# Patient Record
Sex: Male | Born: 1960 | Race: White | Hispanic: No | Marital: Single | Smoking: Former smoker
Health system: Southern US, Community
[De-identification: ages and names within clinical notes are randomized; demographics above are authoritative.]

## PROBLEM LIST (undated history)

## (undated) DIAGNOSIS — L719 Rosacea, unspecified: Secondary | ICD-10-CM

## (undated) DIAGNOSIS — N529 Male erectile dysfunction, unspecified: Secondary | ICD-10-CM

## (undated) DIAGNOSIS — E785 Hyperlipidemia, unspecified: Secondary | ICD-10-CM

## (undated) DIAGNOSIS — B001 Herpesviral vesicular dermatitis: Secondary | ICD-10-CM

## (undated) DIAGNOSIS — F988 Other specified behavioral and emotional disorders with onset usually occurring in childhood and adolescence: Secondary | ICD-10-CM

## (undated) DIAGNOSIS — F32A Depression, unspecified: Secondary | ICD-10-CM

## (undated) DIAGNOSIS — F419 Anxiety disorder, unspecified: Secondary | ICD-10-CM

## (undated) HISTORY — PX: COLONOSCOPY: SHX174

## (undated) HISTORY — DX: Anxiety disorder, unspecified: F41.9

## (undated) HISTORY — DX: Male erectile dysfunction, unspecified: N52.9

## (undated) HISTORY — PX: OPEN REDUCTION INTERNAL FIXATION (ORIF) HAND: SHX5991

## (undated) HISTORY — DX: Depression, unspecified: F32.A

## (undated) HISTORY — DX: Other specified behavioral and emotional disorders with onset usually occurring in childhood and adolescence: F98.8

## (undated) HISTORY — DX: Herpesviral vesicular dermatitis: B00.1

## (undated) HISTORY — DX: Hyperlipidemia, unspecified: E78.5

## (undated) HISTORY — DX: Rosacea, unspecified: L71.9

---
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
## 1993-03-16 DIAGNOSIS — F419 Anxiety disorder, unspecified: Secondary | ICD-10-CM | POA: Insufficient documentation

## 1998-03-16 DIAGNOSIS — F32A Depression, unspecified: Secondary | ICD-10-CM | POA: Insufficient documentation

## 2008-03-23 ENCOUNTER — Ambulatory Visit: Payer: Self-pay | Admitting: Family Medicine

## 2008-04-05 ENCOUNTER — Ambulatory Visit: Payer: Self-pay | Admitting: Family Medicine

## 2009-12-30 ENCOUNTER — Emergency Department (HOSPITAL_COMMUNITY): Admission: EM | Admit: 2009-12-30 | Discharge: 2009-12-30 | Payer: Self-pay | Admitting: Emergency Medicine

## 2010-12-25 ENCOUNTER — Inpatient Hospital Stay (INDEPENDENT_AMBULATORY_CARE_PROVIDER_SITE_OTHER)
Admission: RE | Admit: 2010-12-25 | Discharge: 2010-12-25 | Disposition: A | Discharge status: Home / Self Care | Payer: BC Managed Care – PPO | Source: Ambulatory Visit | Attending: Family Medicine | Admitting: Family Medicine

## 2010-12-25 ENCOUNTER — Ambulatory Visit (INDEPENDENT_AMBULATORY_CARE_PROVIDER_SITE_OTHER): Payer: BC Managed Care – PPO

## 2010-12-25 DIAGNOSIS — J45909 Unspecified asthma, uncomplicated: Secondary | ICD-10-CM

## 2011-05-20 IMAGING — CR DG CHEST 2V
3 series · 3 of 3 positions shown · non-contrast
Comparison: None

CLINICAL DATA: Left-sided chest pain.

CHEST - 2 VIEW

[view not recorded (1 of 3)]
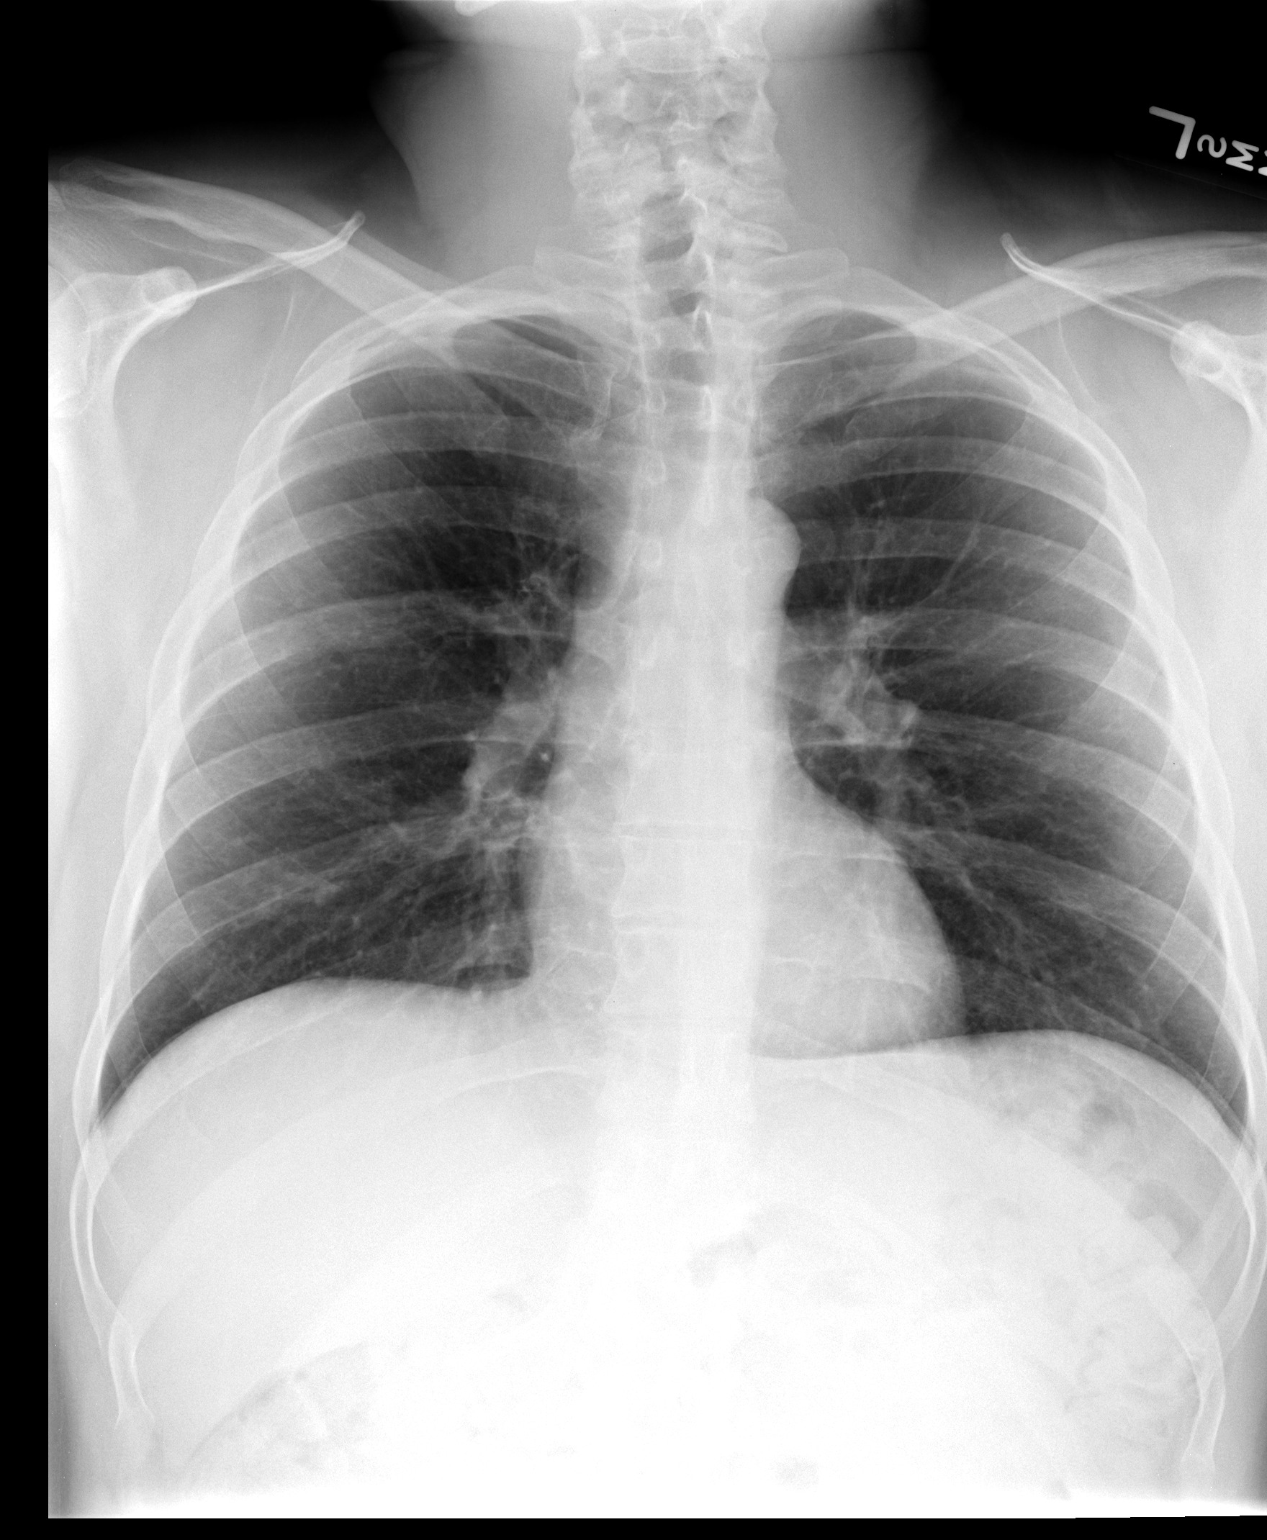

[view not recorded (2 of 3)]
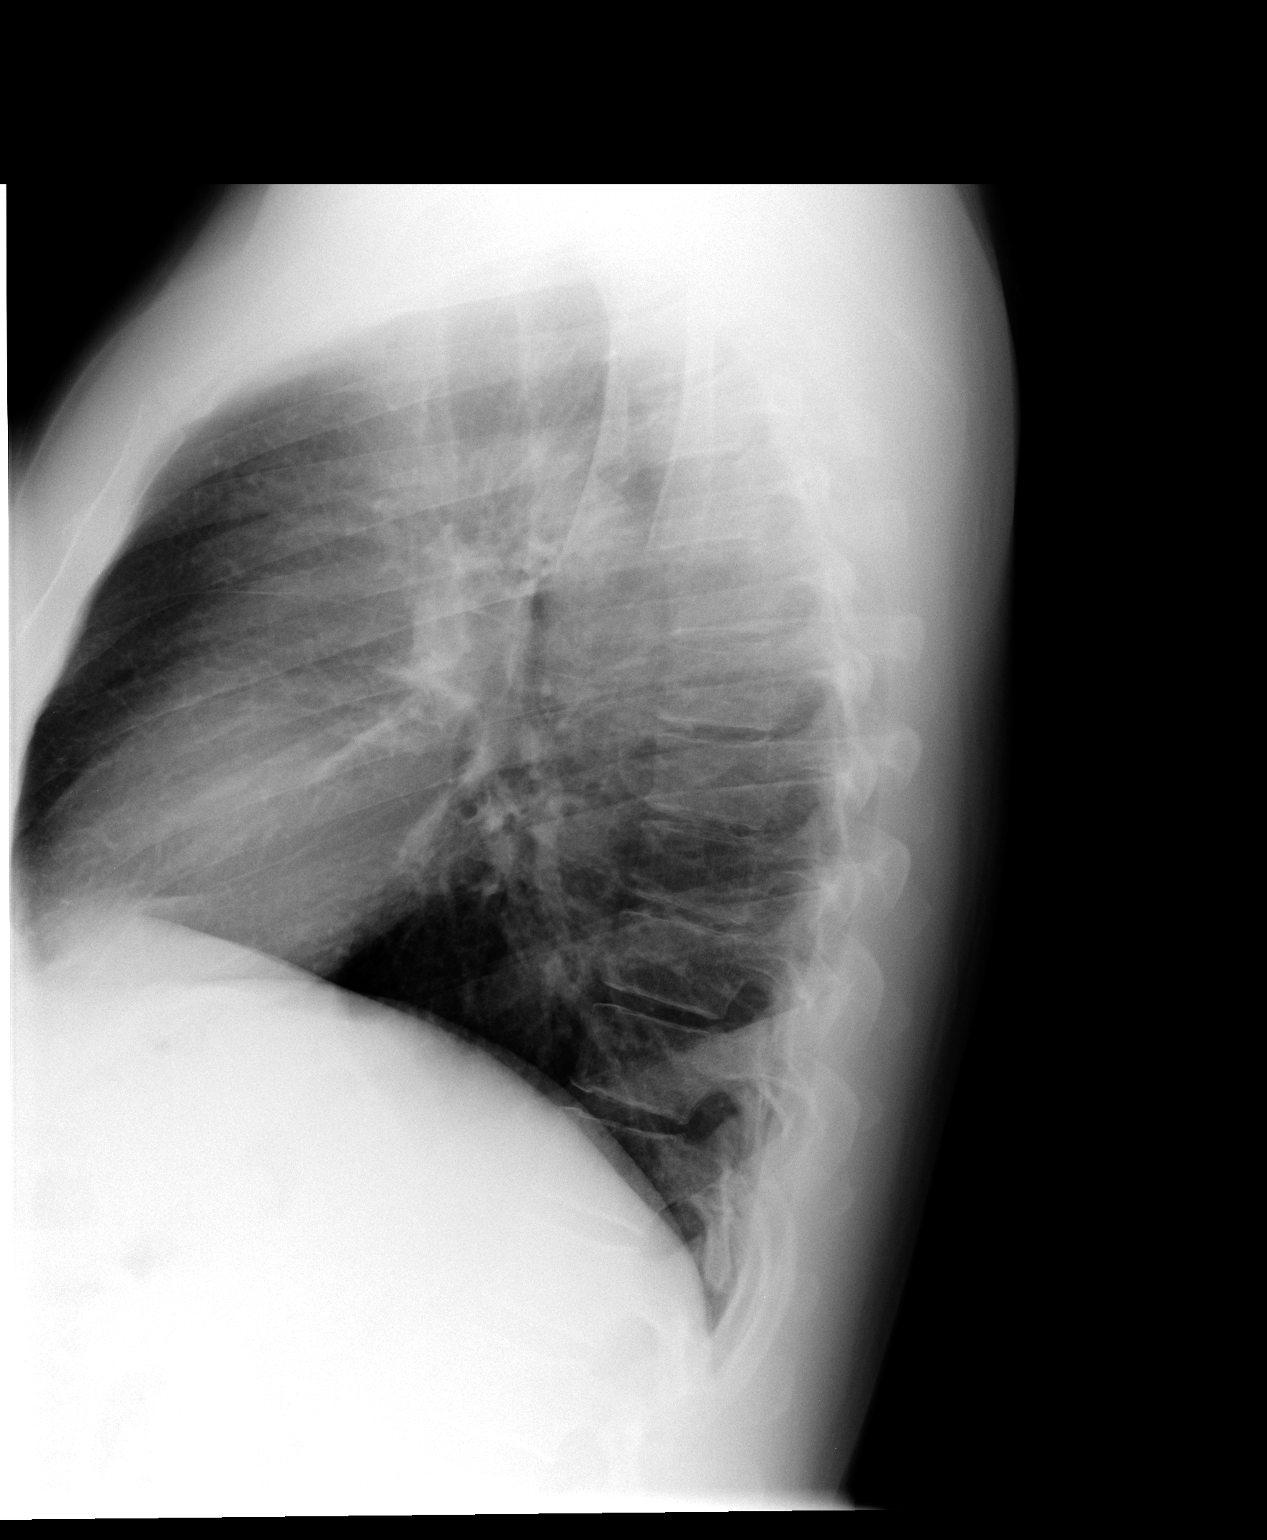

[view not recorded (3 of 3)]
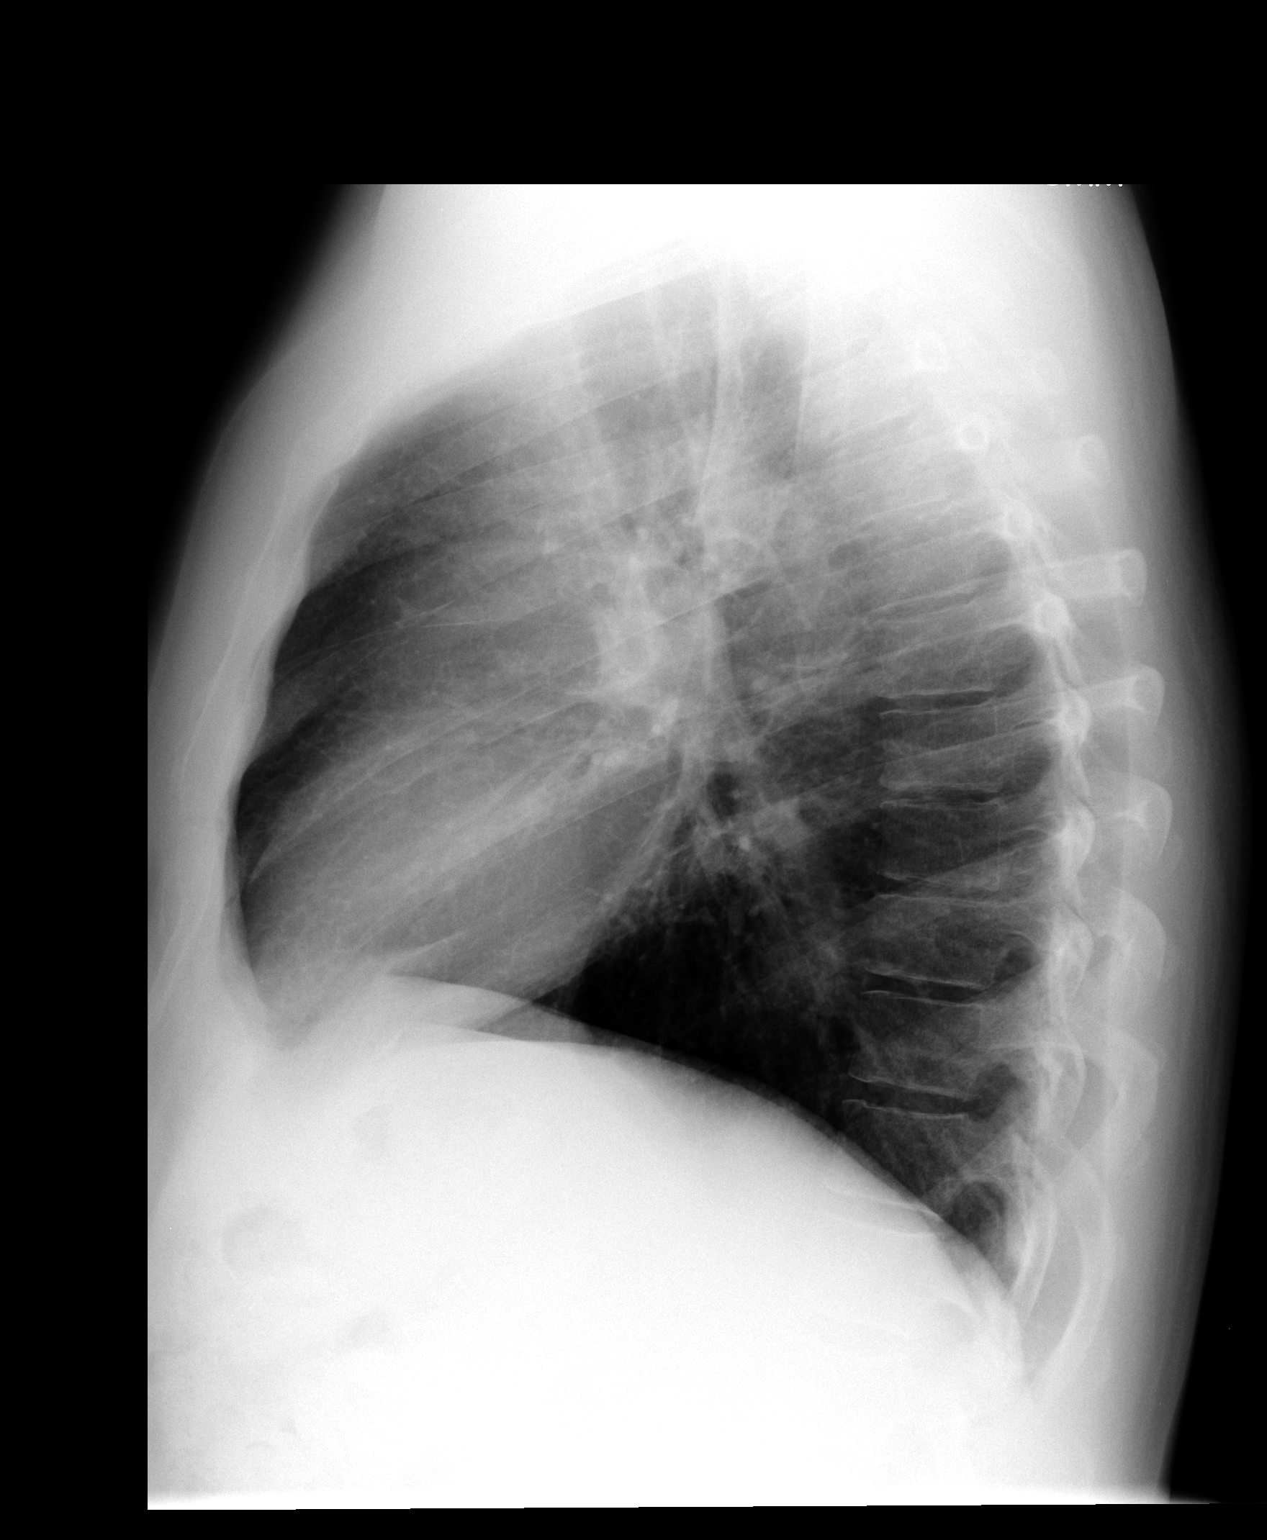

[3 of 3 positions shown; findings below may reference images not displayed]

FINDINGS: The cardiac silhouette, mediastinal and hilar contours
are within normal limits.  The lungs are clear.  The bony thorax is
intact.
IMPRESSION: No acute cardiopulmonary findings.

## 2012-05-14 IMAGING — CR DG CHEST 2V
2 series · 2 of 2 positions shown · non-contrast
Comparison: 12/30/2009

CLINICAL DATA: Shortness of breath, wheezing, cough, nonsmoker

CHEST - 2 VIEW

[view not recorded (1 of 2)]
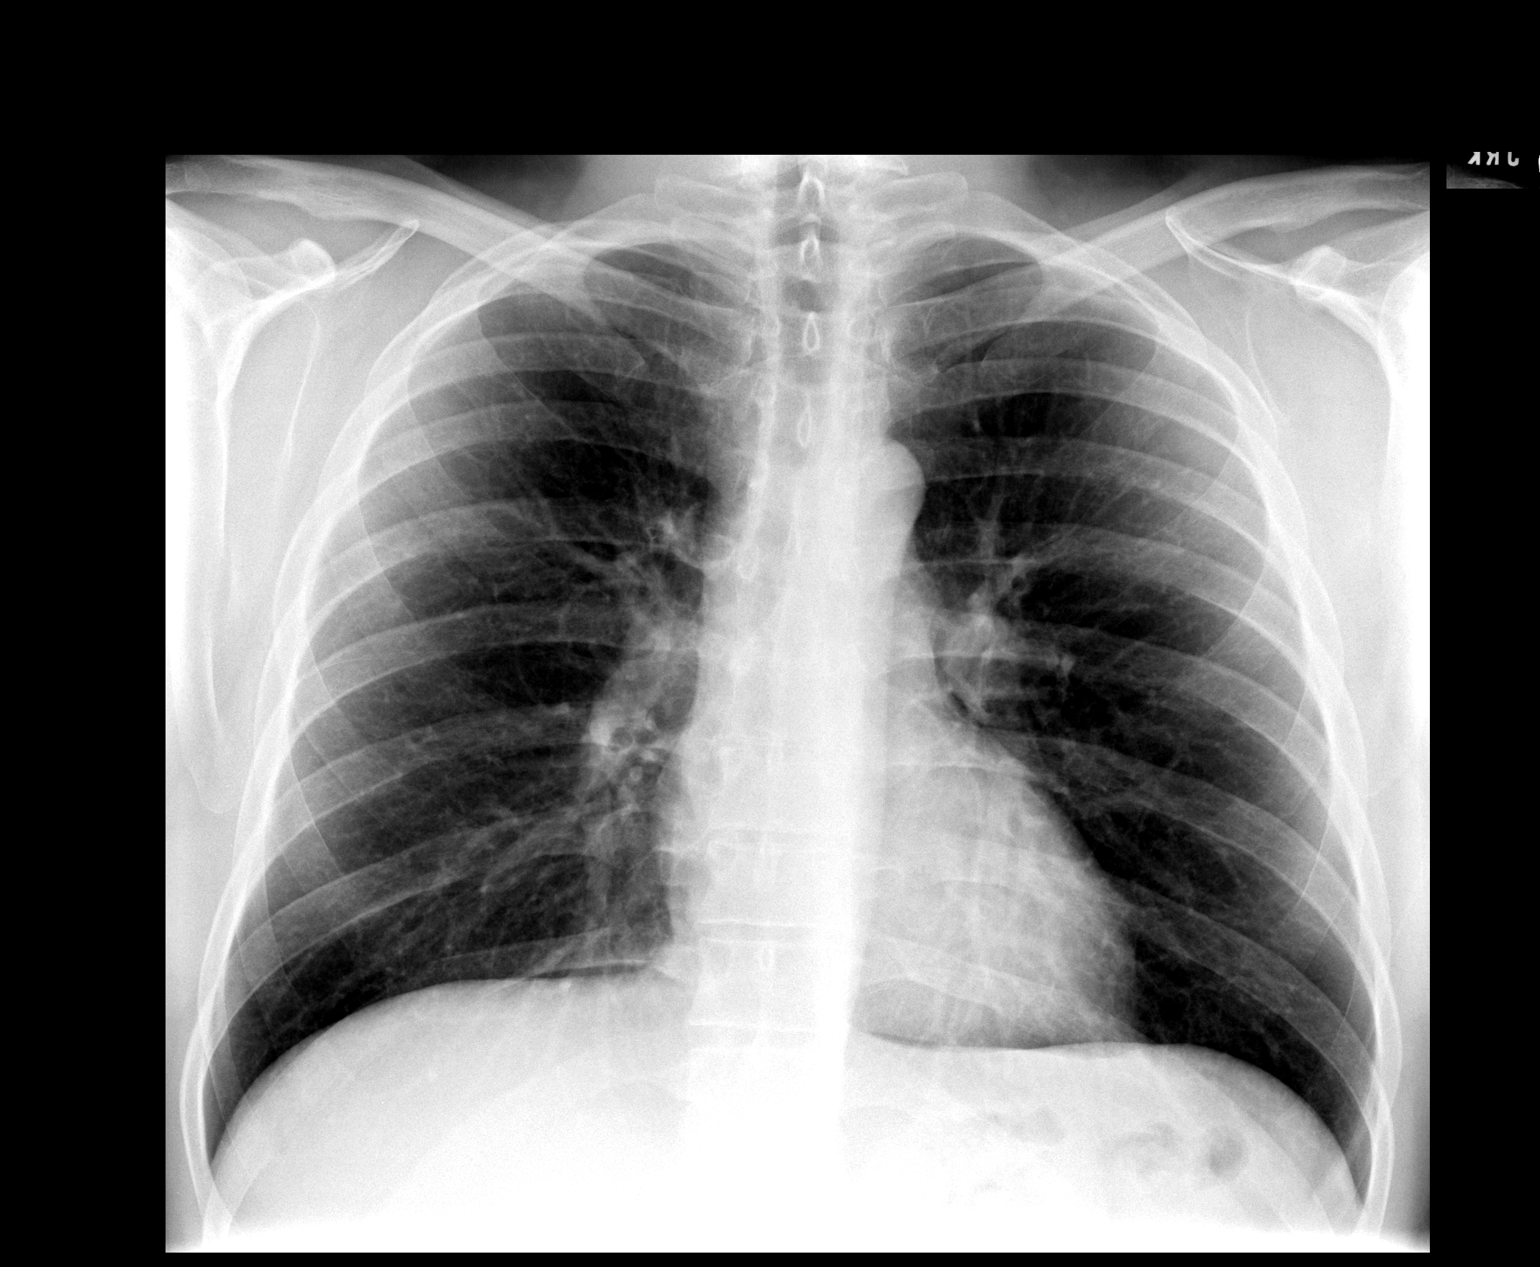

[view not recorded (2 of 2)]
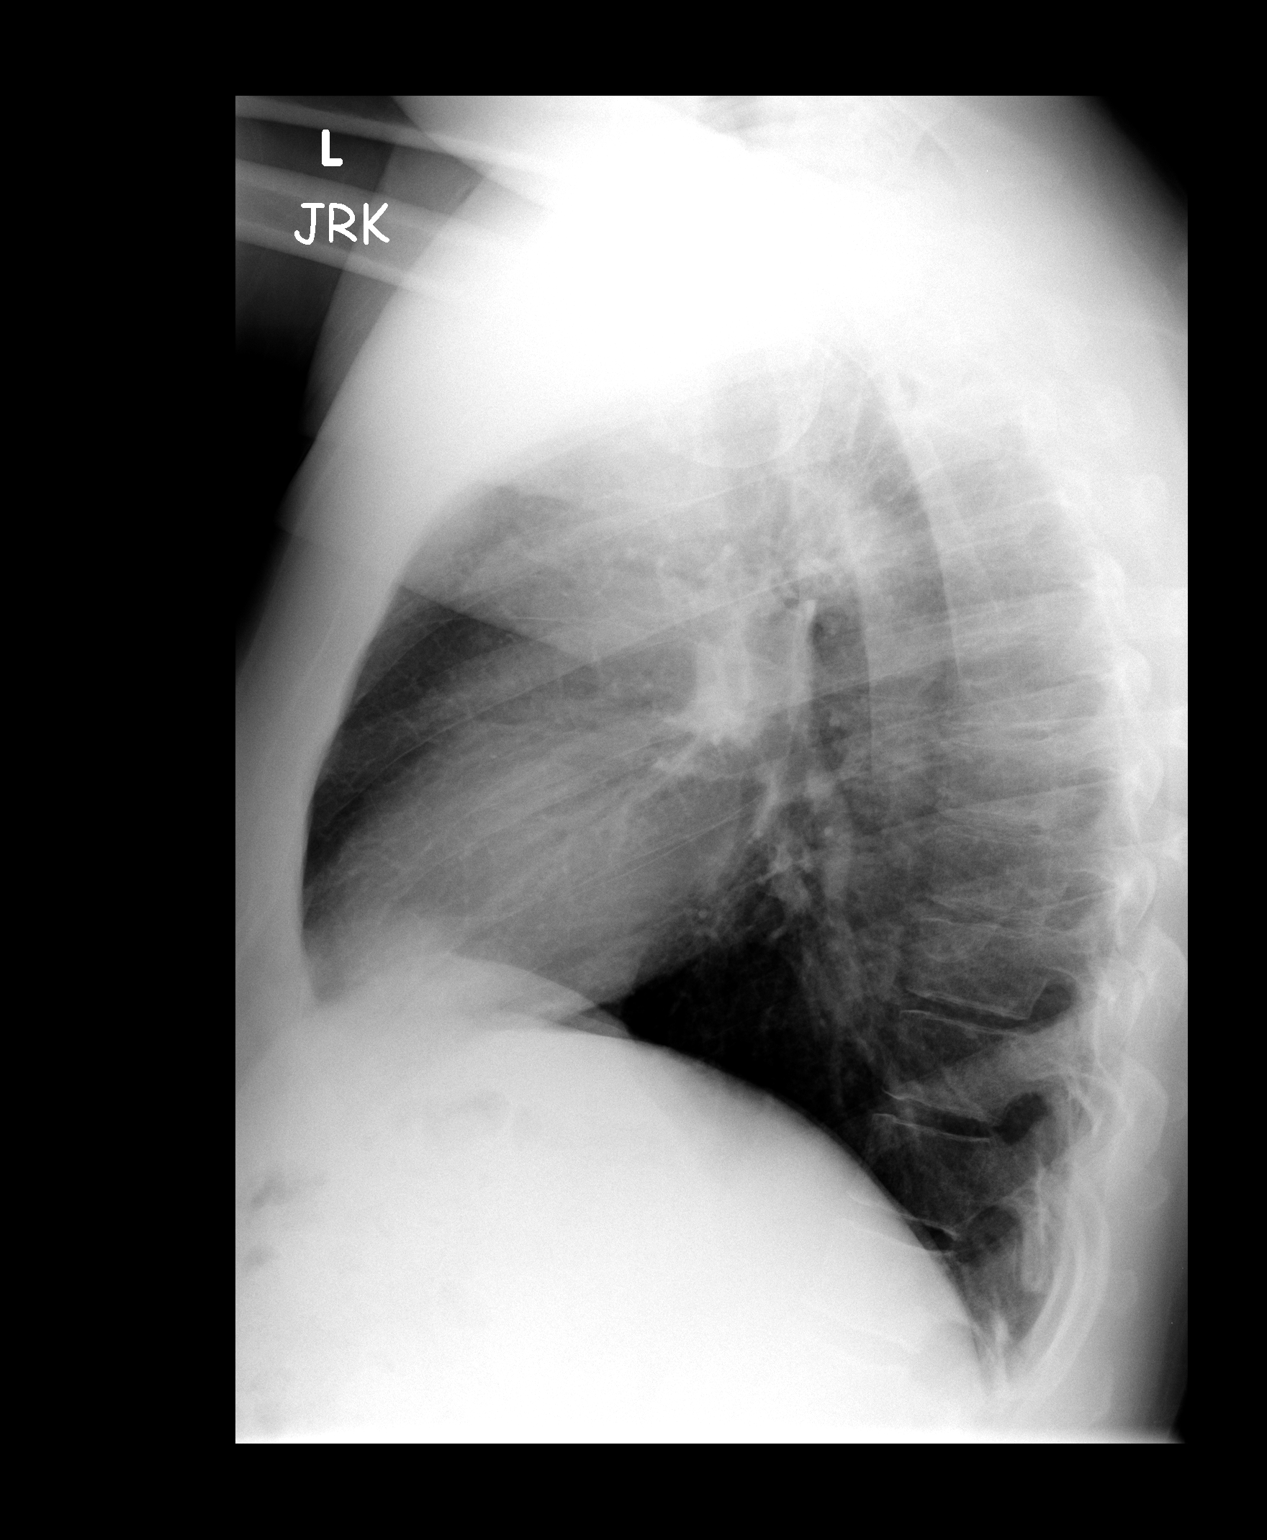

[2 of 2 positions shown; findings below may reference images not displayed]

FINDINGS: Normal heart size, mediastinal contours, and pulmonary vascularity.
Peribronchial thickening without infiltrate or effusion.
No pneumothorax.
Bones unremarkable.
IMPRESSION: Peribronchial thickening, which can be seen with bronchitis or
reactive airway disease.
No acute infiltrate.

## 2012-06-14 ENCOUNTER — Encounter: Payer: Self-pay | Admitting: General Surgery

## 2012-06-14 ENCOUNTER — Ambulatory Visit (INDEPENDENT_AMBULATORY_CARE_PROVIDER_SITE_OTHER): Payer: BC Managed Care – PPO | Admitting: General Surgery

## 2012-06-14 VITALS — BP 120/78 | HR 70 | Resp 12 | Ht 65.0 in | Wt 166.0 lb

## 2012-06-14 DIAGNOSIS — R198 Other specified symptoms and signs involving the digestive system and abdomen: Secondary | ICD-10-CM

## 2012-06-14 DIAGNOSIS — Z1211 Encounter for screening for malignant neoplasm of colon: Secondary | ICD-10-CM

## 2012-06-14 NOTE — Patient Instructions (Addendum)
Patient advised to use wet wipes and/or tucks pads to help with hemorrhoids. Surgery is not advised at this time. Colonoscopy is advised.    Colonoscopy A colonoscopy is an exam to evaluate your entire colon. In this exam, your colon is cleansed. A long fiberoptic tube is inserted through your rectum and into your colon. The fiberoptic scope (endoscope) is a long bundle of enclosed and very flexible fibers. These fibers transmit light to the area examined and send images from that area to your caregiver. Discomfort is usually minimal. You may be given a drug to help you sleep (sedative) during or prior to the procedure. This exam helps to detect lumps (tumors), polyps, inflammation, and areas of bleeding. Your caregiver may also take a small piece of tissue (biopsy) that will be examined under a microscope. LET YOUR CAREGIVER KNOW ABOUT:   Allergies to food or medicine.  Medicines taken, including vitamins, herbs, eyedrops, over-the-counter medicines, and creams.  Use of steroids (by mouth or creams).  Previous problems with anesthetics or numbing medicines.  History of bleeding problems or blood clots.  Previous surgery.  Other health problems, including diabetes and kidney problems.  Possibility of pregnancy, if this applies. BEFORE THE PROCEDURE   A clear liquid diet may be required for 2 days before the exam.  Ask your caregiver about changing or stopping your regular medications.  Liquid injections (enemas) or laxatives may be required.  A large amount of electrolyte solution may be given to you to drink over a short period of time. This solution is used to clean out your colon.  You should be present 60 minutes prior to your procedure or as directed by your caregiver. AFTER THE PROCEDURE   If you received a sedative or pain relieving medication, you will need to arrange for someone to drive you home.  Occasionally, there is a little blood passed with the first bowel  movement. Do not be concerned. FINDING OUT THE RESULTS OF YOUR TEST Not all test results are available during your visit. If your test results are not back during the visit, make an appointment with your caregiver to find out the results. Do not assume everything is normal if you have not heard from your caregiver or the medical facility. It is important for you to follow up on all of your test results. HOME CARE INSTRUCTIONS   It is not unusual to pass moderate amounts of gas and experience mild abdominal cramping following the procedure. This is due to air being used to inflate your colon during the exam. Walking or a warm pack on your belly (abdomen) may help.  You may resume all normal meals and activities after sedatives and medicines have worn off.  Only take over-the-counter or prescription medicines for pain, discomfort, or fever as directed by your caregiver. Do not use aspirin or blood thinners if a biopsy was taken. Consult your caregiver for medicine usage if biopsies were taken. SEEK IMMEDIATE MEDICAL CARE IF:   You have a fever.  You pass large blood clots or fill a toilet with blood following the procedure. This may also occur 10 to 14 days following the procedure. This is more likely if a biopsy was taken.  You develop abdominal pain that keeps getting worse and cannot be relieved with medicine. Document Released: 02/28/2000 Document Revised: 05/25/2011 Document Reviewed: 10/13/2007 [facility] Patient Information 2013 [place], [place].   Patient to be contacted once June 2014 schedule is available to arrange for colonoscopy. This patient is  requesting to wait till he is out of school. Miralax prescription will be sent once date is arranged.

## 2012-06-14 NOTE — Progress Notes (Signed)
Patient ID: Adam Ryan, male   DOB: Oct 24, 1960, 52 y.o.   MRN: [id number]  Chief Complaint  Patient presents with  . Hemorrhoids    HPI Adam Ryan is a 52 y.o. male who presents today with hemorrhoids. He states he has had problems with hemorrhoids for approximately 20 years. No pain or bleeding at this time. He has not had any previous colonoscopies.He states this problem has been bothersome as far as wiping. Occasionally he has bleeding as well as pain.  HPI  Past Medical History  Diagnosis Date  . ADD (attention deficit disorder)   . Hyperlipidemia     Past Surgical History  Procedure Laterality Date  . Wisdom tooth extraction  1981    Family History  Problem Relation Age of Onset  . Cancer Father     bladder    Social History History  Substance Use Topics  . Smoking status: Never Smoker   . Smokeless tobacco: Never Used  . Alcohol Use: 1.5 oz/week    3 drink(s) per week    No Known Allergies  Current Outpatient Prescriptions  Medication Sig Dispense Refill  . atorvastatin (LIPITOR) 40 MG tablet Take 40 mg by mouth daily.       Marland Kitchen lisdexamfetamine (VYVANSE) 40 MG capsule Take 40 mg by mouth every morning.      . meloxicam (MOBIC) 7.5 MG tablet Take 7.5 mg by mouth daily.       . valACYclovir (VALTREX) 500 MG tablet 500 mg 2 (two) times daily as needed.        No current facility-administered medications for this visit.    Review of Systems Review of Systems  Constitutional: Negative.   Respiratory: Negative.   Cardiovascular: Negative.   Gastrointestinal: Negative.     Blood pressure 120/78, pulse 70, resp. rate 12, height 5\' 5"  (1.651 m), weight 166 lb (75.297 kg).  Physical Exam Physical Exam  Constitutional: He appears well-developed and well-nourished.  Cardiovascular: Normal rate, regular rhythm, normal heart sounds and normal pulses.   No murmur heard. Pulmonary/Chest: Effort normal and breath sounds normal.  Abdominal: Soft. Normal  appearance and bowel sounds are normal. There is no splenomegaly or hepatomegaly. There is no tenderness. No hernia.  External skin tag posteriorly anus.    Data Reviewed Primary care physician's office note of May 13, 2012 was reviewed.  Assessment    Colonoscopy screening.  External anal skin tags, modestly symptomatic.     Plan    Colonoscopy.  Conservative management of the skin tags has been recommended. The use of moist wipes has been encouraged for perianal cleansing.        Earline Mayotte 06/15/2012, 9:02 PM

## 2012-06-15 ENCOUNTER — Encounter: Payer: Self-pay | Admitting: General Surgery

## 2012-06-29 ENCOUNTER — Telehealth: Payer: Self-pay | Admitting: *Deleted

## 2012-06-29 NOTE — Telephone Encounter (Signed)
Patient was left a message on cell phone to call the office to schedule June 2014 colonoscopy.

## 2012-07-14 ENCOUNTER — Telehealth: Payer: Self-pay | Admitting: *Deleted

## 2012-07-14 NOTE — Telephone Encounter (Signed)
Patient was left another message on cell number to see if we can get colonoscopy scheduled.

## 2012-07-18 ENCOUNTER — Telehealth: Payer: Self-pay | Admitting: *Deleted

## 2012-07-18 MED ORDER — POLYETHYLENE GLYCOL 3350 17 GM/SCOOP PO POWD: ORAL | Status: DC

## 2012-07-18 NOTE — Telephone Encounter (Signed)
Patient called wanting to arrange colonoscopy at [facility]. This has been scheduled for 09-07-12. Miralax prescription has been sent to patient's pharmacy.

## 2012-09-01 ENCOUNTER — Telehealth: Payer: Self-pay | Admitting: *Deleted

## 2012-09-01 NOTE — Telephone Encounter (Signed)
Message has been left for patient to call the office.  We need to verify that he has had no medication changes since last office visit. Patient is scheduled for a colonoscopy at [facility] on 09-07-12.

## 2012-09-01 NOTE — Telephone Encounter (Signed)
Patient reports no medication changes since last office visit. He states he has pre-registered already. We will proceed with colonoscopy that is scheduled for 09-07-12 at [facility]. Patient to call if he has further questions.

## 2012-09-05 ENCOUNTER — Other Ambulatory Visit: Payer: Self-pay | Admitting: General Surgery

## 2012-09-05 DIAGNOSIS — Z1211 Encounter for screening for malignant neoplasm of colon: Secondary | ICD-10-CM | POA: Insufficient documentation

## 2012-09-07 ENCOUNTER — Ambulatory Visit: Payer: Self-pay | Admitting: General Surgery

## 2012-09-07 DIAGNOSIS — Z860100 Personal history of colon polyps, unspecified: Secondary | ICD-10-CM | POA: Insufficient documentation

## 2012-09-07 DIAGNOSIS — D128 Benign neoplasm of rectum: Secondary | ICD-10-CM

## 2012-09-07 DIAGNOSIS — D129 Benign neoplasm of anus and anal canal: Secondary | ICD-10-CM

## 2012-09-08 ENCOUNTER — Encounter: Payer: Self-pay | Admitting: General Surgery

## 2012-09-09 LAB — PATHOLOGY REPORT

## 2012-09-10 ENCOUNTER — Telehealth: Payer: Self-pay | Admitting: General Surgery

## 2012-09-10 DIAGNOSIS — Z86010 Personal history of colonic polyps: Secondary | ICD-10-CM

## 2012-09-10 NOTE — Telephone Encounter (Signed)
The patient was notified that the polyp removed at the time of the 09/07/2012 colonoscopy was benign. He tubular adenoma was identified, and a 5 year followup exam was encouraged.  He reports he tolerated the procedure well.

## 2012-09-12 ENCOUNTER — Encounter: Payer: Self-pay | Admitting: General Surgery

## 2014-07-08 ENCOUNTER — Emergency Department
Admit: 2014-07-08 | Disposition: A | Discharge status: Home / Self Care | Payer: Self-pay | Admitting: Emergency Medicine

## 2014-08-23 ENCOUNTER — Ambulatory Visit (INDEPENDENT_AMBULATORY_CARE_PROVIDER_SITE_OTHER): Payer: BC Managed Care – PPO | Admitting: Family Medicine

## 2014-08-23 ENCOUNTER — Encounter: Payer: Self-pay | Admitting: Family Medicine

## 2014-08-23 ENCOUNTER — Encounter (INDEPENDENT_AMBULATORY_CARE_PROVIDER_SITE_OTHER): Payer: Self-pay

## 2014-08-23 VITALS — BP 120/86 | HR 91 | Temp 98.4°F | Resp 16 | Ht 64.0 in | Wt 170.2 lb

## 2014-08-23 DIAGNOSIS — E785 Hyperlipidemia, unspecified: Secondary | ICD-10-CM

## 2014-08-23 DIAGNOSIS — F988 Other specified behavioral and emotional disorders with onset usually occurring in childhood and adolescence: Secondary | ICD-10-CM

## 2014-08-23 DIAGNOSIS — F909 Attention-deficit hyperactivity disorder, unspecified type: Secondary | ICD-10-CM | POA: Diagnosis not present

## 2014-08-23 DIAGNOSIS — N481 Balanitis: Secondary | ICD-10-CM | POA: Diagnosis not present

## 2014-08-23 MED ORDER — CLOTRIMAZOLE-BETAMETHASONE 1-0.05 % EX CREA: 1.0000 "application " | TOPICAL_CREAM | Freq: Two times a day (BID) | CUTANEOUS | Status: DC

## 2014-08-23 MED ORDER — AMPHETAMINE-DEXTROAMPHET ER 30 MG PO CP24: 30.0000 mg | ORAL_CAPSULE | ORAL | Status: DC

## 2014-08-23 MED ORDER — ATORVASTATIN CALCIUM 20 MG PO TABS
20.0000 mg | ORAL_TABLET | Freq: Every day | ORAL | Status: DC
Start: 2014-08-23 — End: 2016-02-28

## 2014-08-23 NOTE — Progress Notes (Signed)
Name: Adam Ryan   MRN: [id number]    DOB: [birth year]   Date:08/23/2014       Progress Note  Subjective  Chief Complaint  Chief Complaint  Patient presents with  . ADD    Hyperlipidemia This is a chronic problem. The current episode started more than 1 year ago. The problem is uncontrolled. Recent lipid tests were reviewed and are high. He has no history of diabetes. He is currently on no antihyperlipidemic treatment. The current treatment provides no improvement of lipids. Compliance problems: not taken for 2 mo.  Risk factors for coronary artery disease include dyslipidemia.    ADD ADDERALL working well for control.  BALANITIS AND  Tinea curis  Rash and itching of penis and scrotum.     Past Medical History  Diagnosis Date  . ADD (attention deficit disorder)   . Hyperlipidemia     History  Substance Use Topics  . Smoking status: Former Smoker    Types: Cigars  . Smokeless tobacco: Never Used  . Alcohol Use: 1.8 oz/week    3 Standard drinks or equivalent per week     Current outpatient prescriptions:  .  amphetamine-dextroamphetamine (ADDERALL XR) 30 MG 24 hr capsule, Take 1 capsule (30 mg total) by mouth every morning., Disp: 30 capsule, Rfl: 0 .  amphetamine-dextroamphetamine (ADDERALL XR) 30 MG 24 hr capsule, Take 1 capsule (30 mg total) by mouth every morning., Disp: 30 capsule, Rfl: 0 .  atorvastatin (LIPITOR) 40 MG tablet, Take 40 mg by mouth daily. , Disp: , Rfl:  .  lisdexamfetamine (VYVANSE) 40 MG capsule, Take 40 mg by mouth every morning., Disp: , Rfl:  .  meloxicam (MOBIC) 7.5 MG tablet, Take 7.5 mg by mouth daily. , Disp: , Rfl:  .  polyethylene glycol powder (GLYCOLAX/MIRALAX) powder, 255 grams one bottle for colonoscopy prep, Disp: 255 g, Rfl: 0 .  valACYclovir (VALTREX) 500 MG tablet, 500 mg 2 (two) times daily as needed. , Disp: , Rfl:   No Known Allergies  Review of Systems  Skin: Positive for itching and rash (groin and penis  ).      Objective  Filed Vitals:   08/23/14 1101  BP: 120/86  Pulse: 91  Temp: 98.4 F (36.9 C)  Resp: 16  Height: 5\' 4"  (1.626 m)  Weight: 170 lb 3 oz (77.197 kg)  SpO2: 97%     Physical Exam  Constitutional: He is oriented to person, place, and time and well-developed, well-nourished, and in no distress.  HENT:  Head: Normocephalic.  Eyes: EOM are normal. Pupils are equal, round, and reactive to light.  Neck: Normal range of motion. Neck supple. No thyromegaly present.  Cardiovascular: Normal rate, regular rhythm and normal heart sounds.   No murmur heard. Pulmonary/Chest: Effort normal and breath sounds normal. No respiratory distress. He has no wheezes.  Abdominal: Soft. Bowel sounds are normal.  Genitourinary:  Erythema and rash of groin and penis  Musculoskeletal: Normal range of motion. He exhibits no edema.  Lymphadenopathy:    He has no cervical adenopathy.  Neurological: He is alert and oriented to person, place, and time. No cranial nerve deficit. Gait normal. Coordination normal.  Skin: Skin is warm and dry. Rash noted. There is erythema.  Psychiatric: Affect and judgment normal.      Assessment & Plan  1. ADD (attention deficit disorder) stable - amphetamine-dextroamphetamine (ADDERALL XR) 30 MG 24 hr capsule; Take 1 capsule (30 mg total) by mouth every morning.  Dispense: 30  capsule; Refill: 0 - amphetamine-dextroamphetamine (ADDERALL XR) 30 MG 24 hr capsule; Take 1 capsule (30 mg total) by mouth every morning.  Dispense: 30 capsule; Refill: 0  2. Hyperlipemia   Not currently on meds - Lipid panel - Comprehensive metabolic panel - TSH - atorvastatin (LIPITOR) 20 MG tablet; Take 1 tablet (20 mg total) by mouth daily.  Dispense: 90 tablet; Refill: 3  3. Balanitis  With new onset let's rule out early diabetes - clotrimazole-betamethasone (LOTRISONE) cream; Apply 1 application topically 2 (two) times daily.  Dispense: 30 g; Refill: 0 - POCT glucose  (manual entry) - POCT glycosylated hemoglobin (Hb A1C)

## 2014-08-23 NOTE — Patient Instructions (Signed)
Exercise to Lose Weight Exercise and a healthy diet may help you lose weight. Your doctor may suggest specific exercises. EXERCISE IDEAS AND TIPS  Choose low-cost things you enjoy doing, such as walking, bicycling, or exercising to workout videos.  Take stairs instead of the elevator.  Walk during your lunch break.  Park your car further away from work or school.  Go to a gym or an exercise class.  Start with 5 to 10 minutes of exercise each day. Build up to 30 minutes of exercise 4 to 6 days a week.  Wear shoes with good support and comfortable clothes.  Stretch before and after working out.  Work out until you breathe harder and your heart beats faster.  Drink extra water when you exercise.  Do not do so much that you hurt yourself, feel dizzy, or get very short of breath. Exercises that burn about 150 calories:  Running 1  miles in 15 minutes.  Playing volleyball for 45 to 60 minutes.  Washing and waxing a car for 45 to 60 minutes.  Playing touch football for 45 minutes.  Walking 1  miles in 35 minutes.  Pushing a stroller 1  miles in 30 minutes.  Playing basketball for 30 minutes.  Raking leaves for 30 minutes.  Bicycling 5 miles in 30 minutes.  Walking 2 miles in 30 minutes.  Dancing for 30 minutes.  Shoveling snow for 15 minutes.  Swimming laps for 20 minutes.  Walking up stairs for 15 minutes.  Bicycling 4 miles in 15 minutes.  Gardening for 30 to 45 minutes.  Jumping rope for 15 minutes.  Washing windows or floors for 45 to 60 minutes. Document Released: 04/04/2010 Document Revised: 05/25/2011 Document Reviewed: 04/04/2010 [employer] Patient Information 2015 [employer]. This information is not intended to replace advice given to you by your health care provider. Make sure you discuss any questions you have with your health care provider.  

## 2014-09-24 ENCOUNTER — Ambulatory Visit (INDEPENDENT_AMBULATORY_CARE_PROVIDER_SITE_OTHER): Payer: BC Managed Care – PPO | Admitting: Family Medicine

## 2014-09-24 ENCOUNTER — Encounter: Payer: Self-pay | Admitting: Family Medicine

## 2014-09-24 VITALS — BP 118/78 | HR 87 | Temp 97.7°F | Resp 16 | Ht 64.0 in | Wt 173.0 lb

## 2014-09-24 DIAGNOSIS — F988 Other specified behavioral and emotional disorders with onset usually occurring in childhood and adolescence: Secondary | ICD-10-CM

## 2014-09-24 DIAGNOSIS — Z0000 Encounter for general adult medical examination without abnormal findings: Secondary | ICD-10-CM | POA: Diagnosis not present

## 2014-09-24 DIAGNOSIS — F909 Attention-deficit hyperactivity disorder, unspecified type: Secondary | ICD-10-CM

## 2014-09-24 NOTE — Addendum Note (Signed)
Addended by: Ashok Norris on: 09/24/2014 03:20 PM   Modules accepted: Miquel Dunn

## 2014-09-24 NOTE — Progress Notes (Signed)
Name: Adam Ryan   MRN: [id number]    DOB: 1960/10/03   Date:09/24/2014       Progress Note  Subjective  Chief Complaint  Chief Complaint  Patient presents with  . Annual Exam    HPI 54 year old male presenting for annual H&P.  His baseline problems with hyperlipidemia and ADD have been stable evaluated separately.  Past Medical History  Diagnosis Date  . ADD (attention deficit disorder)   . Hyperlipidemia     History  Substance Use Topics  . Smoking status: Former Smoker    Types: Cigars  . Smokeless tobacco: Never Used  . Alcohol Use: 1.8 oz/week    3 Standard drinks or equivalent per week     Current outpatient prescriptions:  .  amphetamine-dextroamphetamine (ADDERALL XR) 30 MG 24 hr capsule, Take 1 capsule (30 mg total) by mouth every morning., Disp: 30 capsule, Rfl: 0 .  amphetamine-dextroamphetamine (ADDERALL XR) 30 MG 24 hr capsule, Take 1 capsule (30 mg total) by mouth every morning., Disp: 30 capsule, Rfl: 0 .  atorvastatin (LIPITOR) 20 MG tablet, Take 1 tablet (20 mg total) by mouth daily., Disp: 90 tablet, Rfl: 3 .  atorvastatin (LIPITOR) 40 MG tablet, Take 40 mg by mouth daily. , Disp: , Rfl:  .  clotrimazole-betamethasone (LOTRISONE) cream, Apply 1 application topically 2 (two) times daily., Disp: 30 g, Rfl: 0 .  lisdexamfetamine (VYVANSE) 40 MG capsule, Take 40 mg by mouth every morning., Disp: , Rfl:  .  meloxicam (MOBIC) 7.5 MG tablet, Take 7.5 mg by mouth daily. , Disp: , Rfl:  .  valACYclovir (VALTREX) 500 MG tablet, 500 mg 2 (two) times daily as needed. , Disp: , Rfl:  .  polyethylene glycol powder (GLYCOLAX/MIRALAX) powder, 255 grams one bottle for colonoscopy prep (Patient not taking: Reported on 09/24/2014), Disp: 255 g, Rfl: 0  No Known Allergies  Review of Systems  Constitutional: Negative for fever, chills and weight loss.  HENT: Negative for congestion, hearing loss, sore throat and tinnitus.   Eyes: Negative for blurred vision, double  vision and redness.  Respiratory: Negative for cough, hemoptysis and shortness of breath.   Cardiovascular: Negative for chest pain, palpitations, orthopnea, claudication and leg swelling.  Gastrointestinal: Negative for heartburn, nausea, vomiting, diarrhea, constipation and blood in stool.  Genitourinary: Negative for dysuria, urgency, frequency and hematuria.  Musculoskeletal: Negative for myalgias, back pain, joint pain, falls and neck pain.  Skin: Negative for itching.  Neurological: Negative for dizziness, tingling, tremors, focal weakness, seizures, loss of consciousness, weakness and headaches.       ADD  Endo/Heme/Allergies: Does not bruise/bleed easily.  Psychiatric/Behavioral: Negative for depression and substance abuse. The patient is not nervous/anxious and does not have insomnia.      Objective  Filed Vitals:   09/24/14 1327  BP: 118/78  Pulse: 87  Temp: 97.7 F (36.5 C)  Resp: 16  Height: 5\' 4"  (1.626 m)  Weight: 173 lb (78.472 kg)  SpO2: 98%     Physical Exam  Constitutional: He is oriented to person, place, and time and well-developed, well-nourished, and in no distress.  HENT:  Head: Normocephalic.  Eyes: EOM are normal. Pupils are equal, round, and reactive to light.  Neck: Normal range of motion. Neck supple. No thyromegaly present.  Cardiovascular: Normal rate, regular rhythm and normal heart sounds.   No murmur heard. Pulmonary/Chest: Effort normal and breath sounds normal. No respiratory distress. He has no wheezes.  Abdominal: Soft. Bowel sounds are normal.  Genitourinary: Rectum normal, prostate normal and penis normal. Guaiac negative stool.  Musculoskeletal: Normal range of motion. He exhibits no edema.  Lymphadenopathy:    He has no cervical adenopathy.  Neurological: He is alert and oriented to person, place, and time. No cranial nerve deficit. Gait normal. Coordination normal.  Skin: Skin is warm and dry. No rash noted.  Psychiatric: Affect  and judgment normal.      Assessment & Plan  1. Annual physical exam  - PSA - CBC with Differential - POC Hemoccult Bld/Stl (1-Cd Office Dx) - POC Hemoccult Bld/Stl (3-Cd Home Screen); Future

## 2014-09-24 NOTE — Patient Instructions (Signed)
Return is scheduled for ADD follow-up

## 2014-09-26 ENCOUNTER — Other Ambulatory Visit: Payer: Self-pay | Admitting: Family Medicine

## 2014-09-27 LAB — COMPREHENSIVE METABOLIC PANEL
A/G RATIO: 1.7 (ref 1.1–2.5)
ALK PHOS: 85 IU/L (ref 39–117)
ALT: 38 IU/L (ref 0–44)
AST: 29 IU/L (ref 0–40)
Albumin: 4.1 g/dL (ref 3.5–5.5)
BUN/Creatinine Ratio: 17 (ref 9–20)
BUN: 16 mg/dL (ref 6–24)
Bilirubin Total: 0.6 mg/dL (ref 0.0–1.2)
CALCIUM: 9.2 mg/dL (ref 8.7–10.2)
CHLORIDE: 101 mmol/L (ref 97–108)
CO2: 23 mmol/L (ref 18–29)
Creatinine, Ser: 0.94 mg/dL (ref 0.76–1.27)
GFR calc Af Amer: 107 mL/min/{1.73_m2} (ref >59)
GFR, EST NON AFRICAN AMERICAN: 92 mL/min/{1.73_m2} (ref >59)
GLOBULIN, TOTAL: 2.4 g/dL (ref 1.5–4.5)
GLUCOSE: 107 mg/dL — AB (ref 65–99)
Potassium: 5.2 mmol/L (ref 3.5–5.2)
SODIUM: 140 mmol/L (ref 134–144)
Total Protein: 6.5 g/dL (ref 6.0–8.5)

## 2014-09-27 LAB — CBC WITH DIFFERENTIAL/PLATELET
BASOS: 1 %
Basophils Absolute: 0.1 10*3/uL (ref 0.0–0.2)
EOS (ABSOLUTE): 0.4 10*3/uL (ref 0.0–0.4)
EOS: 6 %
HEMATOCRIT: 45 % (ref 37.5–51.0)
HEMOGLOBIN: 15.7 g/dL (ref 12.6–17.7)
IMMATURE GRANS (ABS): 0 10*3/uL (ref 0.0–0.1)
IMMATURE GRANULOCYTES: 0 %
LYMPHS ABS: 1.7 10*3/uL (ref 0.7–3.1)
Lymphs: 25 %
MCH: 31.9 pg (ref 26.6–33.0)
MCHC: 34.9 g/dL (ref 31.5–35.7)
MCV: 92 fL (ref 79–97)
Monocytes Absolute: 0.7 10*3/uL (ref 0.1–0.9)
Monocytes: 11 %
NEUTROS ABS: 3.9 10*3/uL (ref 1.4–7.0)
NEUTROS PCT: 57 %
Platelets: 302 10*3/uL (ref 150–379)
RBC: 4.92 x10E6/uL (ref 4.14–5.80)
RDW: 13.5 % (ref 12.3–15.4)
WBC: 6.7 10*3/uL (ref 3.4–10.8)

## 2014-09-27 LAB — LIPID PANEL
CHOL/HDL RATIO: 3.1 ratio (ref 0.0–5.0)
CHOLESTEROL TOTAL: 199 mg/dL (ref 100–199)
HDL: 65 mg/dL (ref >39)
LDL CALC: 115 mg/dL — AB (ref 0–99)
Triglycerides: 93 mg/dL (ref 0–149)
VLDL Cholesterol Cal: 19 mg/dL (ref 5–40)

## 2014-09-27 LAB — PSA: Prostate Specific Ag, Serum: 2.7 ng/mL (ref 0.0–4.0)

## 2014-09-27 LAB — TSH: TSH: 0.996 u[IU]/mL (ref 0.450–4.500)

## 2014-10-05 ENCOUNTER — Telehealth: Payer: Self-pay | Admitting: Emergency Medicine

## 2014-10-05 NOTE — Telephone Encounter (Signed)
Letter sent to patient, tried to call no answer, left message no return call. Letter sent to patient with results

## 2014-11-12 ENCOUNTER — Telehealth: Payer: Self-pay | Admitting: Family Medicine

## 2014-11-12 MED ORDER — AMPHETAMINE-DEXTROAMPHET ER 30 MG PO CP24: 30.0000 mg | ORAL_CAPSULE | ORAL | Status: DC

## 2014-11-12 NOTE — Telephone Encounter (Signed)
Requesting a refill on adderral. Have appointment for 11-27-14 but his prescription runs out this week.

## 2014-11-12 NOTE — Addendum Note (Signed)
Addended by: Ashok Norris on: 11/12/2014 03:49 PM   Modules accepted: Orders, SmartSet

## 2014-11-26 ENCOUNTER — Encounter: Payer: Self-pay | Admitting: Family Medicine

## 2014-11-26 ENCOUNTER — Ambulatory Visit (INDEPENDENT_AMBULATORY_CARE_PROVIDER_SITE_OTHER): Payer: BC Managed Care – PPO | Admitting: Family Medicine

## 2014-11-26 VITALS — BP 124/80 | HR 86 | Temp 98.6°F | Resp 18 | Ht 64.0 in | Wt 170.8 lb

## 2014-11-26 DIAGNOSIS — F988 Other specified behavioral and emotional disorders with onset usually occurring in childhood and adolescence: Secondary | ICD-10-CM

## 2014-11-26 DIAGNOSIS — F909 Attention-deficit hyperactivity disorder, unspecified type: Secondary | ICD-10-CM

## 2014-11-26 MED ORDER — AMPHETAMINE-DEXTROAMPHET ER 30 MG PO CP24: 30.0000 mg | ORAL_CAPSULE | ORAL | Status: DC

## 2014-11-26 NOTE — Progress Notes (Signed)
Name: Adam Ryan   MRN: [id number]    DOB: 21-Jun-1960   Date:11/26/2014       Progress Note  Subjective  Chief Complaint  Chief Complaint  Patient presents with  . ADHD    3 month follow up    HPI  ADD history of present illness  Patient has a long-standing history of ADD. He is currently on a regimen of amphetamine with extra amphetamine 30 mg on a daily basis. He has no problems with anorexia or insomnia and down there is no history of any significant alcohol usage and no illicit drug usage. He is compliant with medications.  Past Medical History  Diagnosis Date  . ADD (attention deficit disorder)   . Hyperlipidemia     Social History  Substance Use Topics  . Smoking status: Former Smoker    Types: Cigars  . Smokeless tobacco: Never Used  . Alcohol Use: 1.8 oz/week    3 Standard drinks or equivalent per week     Current outpatient prescriptions:  .  amphetamine-dextroamphetamine (ADDERALL XR) 30 MG 24 hr capsule, Take 1 capsule (30 mg total) by mouth every morning., Disp: 30 capsule, Rfl: 0 .  atorvastatin (LIPITOR) 20 MG tablet, Take 1 tablet (20 mg total) by mouth daily., Disp: 90 tablet, Rfl: 3 .  clotrimazole-betamethasone (LOTRISONE) cream, APPLY EXTERNALLY TO THE AFFECTED AREA TWICE DAILY, Disp: 30 g, Rfl: 0 .  polyethylene glycol powder (GLYCOLAX/MIRALAX) powder, 255 grams one bottle for colonoscopy prep (Patient not taking: Reported on 09/24/2014), Disp: 255 g, Rfl: 0 .  valACYclovir (VALTREX) 500 MG tablet, 500 mg 2 (two) times daily as needed. , Disp: , Rfl:   No Known Allergies  Review of Systems  Neurological: Seizures: NO TREMOR IS NOTED.       ADD consisting of inability to concentrate on and finish tasks. No behavioral issues.     Objective  Filed Vitals:   11/26/14 0916  BP: 124/80  Pulse: 86  Temp: 98.6 F (37 C)  TempSrc: Oral  Resp: 18  Height: 5\' 4"  (1.626 m)  Weight: 170 lb 12.8 oz (77.474 kg)  SpO2: 98%     Physical Exam   Constitutional: He is well-developed, well-nourished, and in no distress.  HENT:  Head: Normocephalic.  Eyes: Pupils are equal, round, and reactive to light.  Neck: Normal range of motion.  Pulmonary/Chest: Effort normal.  Neurological:  No tremor  Psychiatric: Mood, memory, affect and judgment normal.      Assessment & Plan   1. ADD (attention deficit disorder)  - amphetamine-dextroamphetamine (ADDERALL XR) 30 MG 24 hr capsule; Take 1 capsule (30 mg total) by mouth every morning.  Dispense: 30 capsule; Refill: 0 - amphetamine-dextroamphetamine (ADDERALL XR) 30 MG 24 hr capsule; Take 1 capsule (30 mg total) by mouth every morning.  Dispense: 30 capsule; Refill: 0 - amphetamine-dextroamphetamine (ADDERALL XR) 30 MG 24 hr capsule; Take 1 capsule (30 mg total) by mouth every morning.  Dispense: 30 capsule; Refill: 0

## 2015-02-25 ENCOUNTER — Encounter: Payer: Self-pay | Admitting: Family Medicine

## 2015-02-25 ENCOUNTER — Ambulatory Visit (INDEPENDENT_AMBULATORY_CARE_PROVIDER_SITE_OTHER): Payer: BC Managed Care – PPO | Admitting: Family Medicine

## 2015-02-25 VITALS — BP 132/84 | HR 98 | Temp 98.6°F | Resp 18 | Ht 64.0 in | Wt 163.6 lb

## 2015-02-25 DIAGNOSIS — F909 Attention-deficit hyperactivity disorder, unspecified type: Secondary | ICD-10-CM

## 2015-02-25 DIAGNOSIS — R739 Hyperglycemia, unspecified: Secondary | ICD-10-CM

## 2015-02-25 DIAGNOSIS — L42 Pityriasis rosea: Secondary | ICD-10-CM | POA: Diagnosis not present

## 2015-02-25 DIAGNOSIS — F988 Other specified behavioral and emotional disorders with onset usually occurring in childhood and adolescence: Secondary | ICD-10-CM | POA: Insufficient documentation

## 2015-02-25 LAB — GLUCOSE, POCT (MANUAL RESULT ENTRY): POC GLUCOSE: 109 mg/dL — AB (ref 70–99)

## 2015-02-25 MED ORDER — AMPHETAMINE-DEXTROAMPHET ER 30 MG PO CP24: 30.0000 mg | ORAL_CAPSULE | ORAL | Status: DC

## 2015-02-25 NOTE — Progress Notes (Signed)
Name: Adam Ryan   MRN: [id number]    DOB: 07-Jan-1961   Date:02/25/2015       Progress Note  Subjective  Chief Complaint  Chief Complaint  Patient presents with  . ADHD    3 month follow up med refill  . Rash    on back and abdomen for 1 week    HPI  ADD history of present illness  Patient has a history of ADD for over 5 years. His symptoms consist mainly of inattentiveness and difficulty completing tasks. There is no hyperactivity. There's been no insomnia palpitations weight loss.  Rash   Complaint  of a rash on the abdomen and back for the past one week.. The rashes noted on his back and trunk and is slightly pruritic. This been no fever or chills or any exposure to anyone else with a rash.  Hyperlipidemia  History of elevated glucose to 107 on a fasting specimen. No polyuria polydipsia polyphagia. There is no history of diabetes personally or in the family. There has been some weight loss with that is been intentional of about 8 pounds in the past 3 months.    Past Medical History  Diagnosis Date  . ADD (attention deficit disorder)   . Hyperlipidemia     Social History  Substance Use Topics  . Smoking status: Former Smoker    Types: Cigars  . Smokeless tobacco: Never Used  . Alcohol Use: 1.8 oz/week    3 Standard drinks or equivalent per week     Current outpatient prescriptions:  .  amphetamine-dextroamphetamine (ADDERALL XR) 30 MG 24 hr capsule, Take 1 capsule (30 mg total) by mouth every morning., Disp: 30 capsule, Rfl: 0 .  amphetamine-dextroamphetamine (ADDERALL XR) 30 MG 24 hr capsule, Take 1 capsule (30 mg total) by mouth every morning., Disp: 30 capsule, Rfl: 0 .  amphetamine-dextroamphetamine (ADDERALL XR) 30 MG 24 hr capsule, Take 1 capsule (30 mg total) by mouth every morning., Disp: 30 capsule, Rfl: 0 .  atorvastatin (LIPITOR) 20 MG tablet, Take 1 tablet (20 mg total) by mouth daily., Disp: 90 tablet, Rfl: 3 .  clotrimazole-betamethasone  (LOTRISONE) cream, APPLY EXTERNALLY TO THE AFFECTED AREA TWICE DAILY, Disp: 30 g, Rfl: 0 .  valACYclovir (VALTREX) 500 MG tablet, 500 mg 2 (two) times daily as needed. , Disp: , Rfl:  .  polyethylene glycol powder (GLYCOLAX/MIRALAX) powder, 255 grams one bottle for colonoscopy prep (Patient not taking: Reported on 09/24/2014), Disp: 255 g, Rfl: 0  No Known Allergies  Review of Systems  Constitutional: Positive for weight loss. Negative for fever and chills.  HENT: Negative for congestion, hearing loss, sore throat and tinnitus.   Eyes: Negative for blurred vision, double vision and redness.  Respiratory: Negative for cough, hemoptysis and shortness of breath.   Cardiovascular: Negative for chest pain, palpitations, orthopnea, claudication and leg swelling.  Gastrointestinal: Negative for heartburn, nausea, vomiting, diarrhea, constipation and blood in stool.  Genitourinary: Negative for dysuria, urgency, frequency and hematuria.  Musculoskeletal: Negative for myalgias, back pain, joint pain, falls and neck pain.  Skin: Positive for rash. Negative for itching.  Neurological: Negative for dizziness, tingling, tremors, focal weakness, seizures, loss of consciousness, weakness and headaches.       ADD  Endo/Heme/Allergies: Does not bruise/bleed easily.  Psychiatric/Behavioral: Negative for depression and substance abuse. The patient has insomnia. The patient is not nervous/anxious.      Objective  Filed Vitals:   02/25/15 1003  BP: 132/84  Pulse: 98  Temp: 98.6 F (37 C)  TempSrc: Oral  Resp: 18  Height: 5\' 4"  (1.626 m)  Weight: 163 lb 9.6 oz (74.208 kg)  SpO2: 97%     Physical Exam  Constitutional: He is oriented to person, place, and time and well-developed, well-nourished, and in no distress.  HENT:  Head: Normocephalic.  Eyes: EOM are normal. Pupils are equal, round, and reactive to light.  Neck: Normal range of motion. Neck supple. No thyromegaly present.  Cardiovascular:  Normal rate, regular rhythm and normal heart sounds.   No murmur heard. Pulmonary/Chest: Effort normal and breath sounds normal. No respiratory distress. He has no wheezes.  Abdominal: Soft. Bowel sounds are normal.  Musculoskeletal: Normal range of motion. He exhibits no edema.  Lymphadenopathy:    He has no cervical adenopathy.  Neurological: He is alert and oriented to person, place, and time. No cranial nerve deficit. Gait normal. Coordination normal.  Skin: Skin is warm and dry. Rash (maculopapular rash which is slightly erythematous in a Christmas tree distribution particularly on the back. I do not see a definite hernia patch) noted.  Psychiatric: Judgment normal.  Anxious and loquacious      Assessment & Plan  1. ADD (attention deficit disorder) Continue  Current meds  - amphetamine-dextroamphetamine (ADDERALL XR) 30 MG 24 hr capsule; Take 1 capsule (30 mg total) by mouth every morning.  Dispense: 30 capsule; Refill: 0 - amphetamine-dextroamphetamine (ADDERALL XR) 30 MG 24 hr capsule; Take 1 capsule (30 mg total) by mouth every morning.  Dispense: 30 capsule; Refill: 0 - amphetamine-dextroamphetamine (ADDERALL XR) 30 MG 24 hr capsule; Take 1 capsule (30 mg total) by mouth every morning.  Dispense: 30 capsule; Refill: 0  2. Pityriasis rosea Handout and explained benign  3. Hyperglycemia Check glucose and A1c - POCT Glucose (CBG) - POCT HgB A1C

## 2015-03-19 ENCOUNTER — Telehealth: Payer: Self-pay | Admitting: Family Medicine

## 2015-03-19 NOTE — Telephone Encounter (Signed)
James from [facility] is requesting refill on Adderral 30 mg. His new insurance [employer] requires prior authorization.  [id number]

## 2015-04-30 ENCOUNTER — Other Ambulatory Visit: Payer: Self-pay | Admitting: Family Medicine

## 2015-04-30 DIAGNOSIS — F988 Other specified behavioral and emotional disorders with onset usually occurring in childhood and adolescence: Secondary | ICD-10-CM

## 2015-04-30 MED ORDER — AMPHETAMINE-DEXTROAMPHET ER 30 MG PO CP24: 30.0000 mg | ORAL_CAPSULE | ORAL | Status: DC

## 2015-05-16 ENCOUNTER — Other Ambulatory Visit: Payer: Self-pay | Admitting: Family Medicine

## 2015-05-27 ENCOUNTER — Ambulatory Visit: Payer: BC Managed Care – PPO | Admitting: Family Medicine

## 2015-06-27 ENCOUNTER — Other Ambulatory Visit: Payer: Self-pay | Admitting: Family Medicine

## 2015-06-27 ENCOUNTER — Other Ambulatory Visit: Payer: Self-pay | Admitting: Emergency Medicine

## 2015-06-27 DIAGNOSIS — F988 Other specified behavioral and emotional disorders with onset usually occurring in childhood and adolescence: Secondary | ICD-10-CM

## 2015-06-27 MED ORDER — AMPHETAMINE-DEXTROAMPHET ER 30 MG PO CP24: 30.0000 mg | ORAL_CAPSULE | ORAL | Status: DC

## 2015-07-18 ENCOUNTER — Ambulatory Visit: Payer: BC Managed Care – PPO | Admitting: Family Medicine

## 2015-08-07 ENCOUNTER — Other Ambulatory Visit: Payer: Self-pay | Admitting: Family Medicine

## 2015-11-14 ENCOUNTER — Telehealth: Payer: Self-pay | Admitting: Family Medicine

## 2015-11-14 DIAGNOSIS — F988 Other specified behavioral and emotional disorders with onset usually occurring in childhood and adolescence: Secondary | ICD-10-CM

## 2015-11-14 NOTE — Telephone Encounter (Signed)
I'm sorry, but we'll need to see him for the Rx

## 2015-11-15 NOTE — Telephone Encounter (Signed)
Left voice mail

## 2015-11-20 NOTE — Telephone Encounter (Signed)
done

## 2015-11-26 ENCOUNTER — Encounter: Payer: Self-pay | Admitting: Family Medicine

## 2015-11-26 ENCOUNTER — Ambulatory Visit (INDEPENDENT_AMBULATORY_CARE_PROVIDER_SITE_OTHER): Payer: BC Managed Care – PPO | Admitting: Family Medicine

## 2015-11-26 VITALS — BP 118/64 | HR 79 | Temp 98.0°F | Wt 171.3 lb

## 2015-11-26 DIAGNOSIS — Z5181 Encounter for therapeutic drug level monitoring: Secondary | ICD-10-CM | POA: Insufficient documentation

## 2015-11-26 DIAGNOSIS — R7303 Prediabetes: Secondary | ICD-10-CM | POA: Diagnosis not present

## 2015-11-26 DIAGNOSIS — F909 Attention-deficit hyperactivity disorder, unspecified type: Secondary | ICD-10-CM | POA: Diagnosis not present

## 2015-11-26 DIAGNOSIS — Z114 Encounter for screening for human immunodeficiency virus [HIV]: Secondary | ICD-10-CM | POA: Diagnosis not present

## 2015-11-26 DIAGNOSIS — Z9189 Other specified personal risk factors, not elsewhere classified: Secondary | ICD-10-CM

## 2015-11-26 DIAGNOSIS — Z79899 Other long term (current) drug therapy: Secondary | ICD-10-CM

## 2015-11-26 DIAGNOSIS — E782 Mixed hyperlipidemia: Secondary | ICD-10-CM | POA: Insufficient documentation

## 2015-11-26 DIAGNOSIS — Z1159 Encounter for screening for other viral diseases: Secondary | ICD-10-CM

## 2015-11-26 DIAGNOSIS — E785 Hyperlipidemia, unspecified: Secondary | ICD-10-CM

## 2015-11-26 DIAGNOSIS — F988 Other specified behavioral and emotional disorders with onset usually occurring in childhood and adolescence: Secondary | ICD-10-CM

## 2015-11-26 MED ORDER — AMPHETAMINE-DEXTROAMPHET ER 30 MG PO CP24: 30.0000 mg | ORAL_CAPSULE | ORAL | 0 refills | Status: DC

## 2015-11-26 NOTE — Assessment & Plan Note (Signed)
Check TSH 

## 2015-11-26 NOTE — Patient Instructions (Addendum)
Consider checking out Answers to Distraction by [place] and [place] = only handle it once Please have fasting labs done in the next week or two Return in 3 months

## 2015-11-26 NOTE — Assessment & Plan Note (Signed)
Check lipids; continue statin; limit saturated fats 

## 2015-11-26 NOTE — Assessment & Plan Note (Signed)
Patient wishes to start back on same medicine he has taken for years; [employer] site reviewed; will get UDS and have him sign controlled substance contract; encouraged him to check out book by [facility] and [facility]; try "OHIO"; Rx given, will be able to provide 3 months total if UDS clean; return in 3 months

## 2015-11-26 NOTE — Assessment & Plan Note (Signed)
Discussed one-time HIV screening recommendation per USPSTF guidelines; patient agrees with testing; HIV antibody ordered 

## 2015-11-26 NOTE — Progress Notes (Signed)
l °

## 2015-11-26 NOTE — Assessment & Plan Note (Signed)
Discussed one-time hep C screening recommendation for individuals born between 1945-1965 per USPSTF guidelines; patient agrees with testing; Hep C Ab ordered 

## 2015-11-26 NOTE — Progress Notes (Signed)
BP 118/64   Pulse 79   Temp 98 F (36.7 C) (Oral)   Wt 171 lb 4.8 oz (77.7 kg)   SpO2 97%   BMI 29.40 kg/m    Subjective:    Patient ID: Adam Ryan, male    DOB: January 29, 1961, 55 y.o.   MRN: [id number]  HPI: Adam Ryan is a 55 y.o. male  Chief Complaint  Patient presents with  . 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 Having trouble focusing; feeling overwhelmed; knows he can so much better; mood has been down; no SI/HI Used to take Adderall XR 30 mg; no palpitations or chest pain; just finished in June The medicine causes weight loss, but regained Low motivation; no symptoms suggestive of testosterone  High cholesterol; on statin; tries to limit fatty meats  Prediabetes; father was diabetic; has been trying to lose weight; trying to limit carbs  Depression screen [facility] 2/9 11/26/2015 02/25/2015 11/26/2014 09/24/2014  Decreased Interest 0 0 0 0  Down, Depressed, Hopeless 3 0 0 0  PHQ - 2 Score 3 0 0 0  Altered sleeping 0 - - -  Tired, decreased energy 1 - - -  Change in appetite 2 - - -  Feeling bad or failure about yourself  3 - - -  Trouble concentrating 3 - - -  Moving slowly or fidgety/restless 1 - - -  Suicidal thoughts 0 - - -  PHQ-9 Score 13 - - -  Difficult doing work/chores Very difficult - - -   Relevant past medical, surgical, family and social history reviewed Past Medical History:  Diagnosis Date  . ADD (attention deficit disorder)   . Hyperlipidemia    Past Surgical History:  Procedure Laterality Date  . WISDOM TOOTH EXTRACTION  1981   Family History  Problem Relation Age of Onset  . Cancer Father     bladder  . Asthma Father    Social History  Substance Use Topics  . Smoking status: Former  Smoker    Types: Cigars  . Smokeless tobacco: Never Used  . Alcohol use 1.8 oz/week    3 Standard drinks or equivalent per week   Interim medical history since last visit reviewed. Allergies and medications reviewed  Review of Systems Per HPI unless specifically indicated above     Objective:    BP 118/64   Pulse 79   Temp 98 F (36.7 C) (Oral)   Wt 171 lb 4.8 oz (77.7 kg)   SpO2 97%   BMI 29.40 kg/m   Wt Readings from Last 3 Encounters:  11/26/15 171 lb 4.8 oz (77.7 kg)  02/25/15 163 lb 9.6 oz (74.2 kg)  11/26/14 170 lb 12.8 oz (77.5 kg)    Physical Exam  Constitutional: He appears well-developed and well-nourished. No distress.  HENT:  Head: Normocephalic and atraumatic.  Eyes: EOM are normal. No scleral icterus.  Neck: No thyromegaly present.  Cardiovascular: Normal rate and regular rhythm.   Pulmonary/Chest: Effort normal and breath sounds normal.  Abdominal: Soft. He exhibits no distension.  Musculoskeletal: He exhibits no edema.  Neurological: He is alert. He displays no tremor.  No tics  Skin: Skin is warm and dry. No pallor.  Psychiatric: He has a normal mood and  affect. His behavior is normal. Judgment and thought content normal. His mood appears not anxious. He does not exhibit a depressed mood.    Results for orders placed or performed in visit on 02/25/15  POCT Glucose (CBG)  Result Value Ref Range   POC Glucose 109 (A) 70 - 99 mg/dl      Assessment & Plan:   Problem List Items Addressed This Visit      Other   Screening for HIV (human immunodeficiency virus)    Discussed one-time HIV screening recommendation per USPSTF guidelines; patient agrees with testing; HIV antibody ordered       Relevant Orders   HIV antibody   Prediabetes   Relevant Orders   Hemoglobin A1c   Lipid panel   Need for hepatitis C screening test    Discussed one-time hep C screening recommendation for individuals born between 1945-1965 per USPSTF guidelines; patient  agrees with testing; Hep C Ab ordered       Relevant Orders   Hepatitis C antibody   Medication monitoring encounter   Relevant Orders   CBC with Differential/Platelet   Comprehensive metabolic panel   Hyperlipidemia    Check lipids; continue statin; limit saturated fats      Relevant Orders   Lipid panel   History of weight change    Check TSH      Relevant Orders   TSH   Controlled substance agreement signed    Controlled substance agreement necessity explained; UDS collected per protocol      ADD (attention deficit disorder) - Primary    Patient wishes to start back on same medicine he has taken for years; [employer] site reviewed; will get UDS and have him sign controlled substance contract; encouraged him to check out book by [place] and [facility]; try "OHIO"; Rx given, will be able to provide 3 months total if UDS clean; return in 3 months      Relevant Medications   amphetamine-dextroamphetamine (ADDERALL XR) 30 MG 24 hr capsule    Other Visit Diagnoses   None.      Follow up plan: Return in about 3 months (around 02/25/2016).  An after-visit summary was printed and given to the patient at [facility].  Please see the patient instructions which may contain other information and recommendations beyond what is mentioned above in the assessment and plan.  Meds ordered this encounter  Medications  . Ciclopirox 1 % shampoo  . amphetamine-dextroamphetamine (ADDERALL XR) 30 MG 24 hr capsule    Sig: Take 1 capsule (30 mg total) by mouth every morning.    Dispense:  30 capsule    Refill:  0    Orders Placed This Encounter  Procedures  . Hemoglobin A1c  . Lipid panel  . CBC with Differential/Platelet  . TSH  . Comprehensive metabolic panel  . HIV antibody  . Hepatitis C antibody

## 2015-11-26 NOTE — Assessment & Plan Note (Signed)
Controlled substance agreement necessity explained; UDS collected per protocol

## 2015-11-30 ENCOUNTER — Other Ambulatory Visit
Admission: AD | Admit: 2015-11-30 | Discharge: 2015-11-30 | Disposition: A | Discharge status: Home / Self Care | Payer: BC Managed Care – PPO | Source: Ambulatory Visit | Attending: Family Medicine | Admitting: Family Medicine

## 2015-11-30 DIAGNOSIS — Z9189 Other specified personal risk factors, not elsewhere classified: Secondary | ICD-10-CM | POA: Diagnosis not present

## 2015-11-30 DIAGNOSIS — R7303 Prediabetes: Secondary | ICD-10-CM | POA: Diagnosis not present

## 2015-11-30 DIAGNOSIS — E785 Hyperlipidemia, unspecified: Secondary | ICD-10-CM | POA: Diagnosis not present

## 2015-11-30 DIAGNOSIS — Z114 Encounter for screening for human immunodeficiency virus [HIV]: Secondary | ICD-10-CM | POA: Diagnosis not present

## 2015-11-30 DIAGNOSIS — Z5181 Encounter for therapeutic drug level monitoring: Secondary | ICD-10-CM | POA: Insufficient documentation

## 2015-11-30 DIAGNOSIS — Z1159 Encounter for screening for other viral diseases: Secondary | ICD-10-CM | POA: Insufficient documentation

## 2015-11-30 LAB — COMPREHENSIVE METABOLIC PANEL WITH GFR
ALT: 32 U/L (ref 17–63)
AST: 24 U/L (ref 15–41)
Albumin: 4.1 g/dL (ref 3.5–5.0)
Alkaline Phosphatase: 70 U/L (ref 38–126)
Anion gap: 6 (ref 5–15)
BUN: 24 mg/dL — ABNORMAL HIGH (ref 6–20)
CO2: 29 mmol/L (ref 22–32)
Calcium: 8.7 mg/dL — ABNORMAL LOW (ref 8.9–10.3)
Chloride: 104 mmol/L (ref 101–111)
Creatinine, Ser: 1.08 mg/dL (ref 0.61–1.24)
GFR calc Af Amer: >60 mL/min
GFR calc non Af Amer: >60 mL/min
Glucose, Bld: 103 mg/dL — ABNORMAL HIGH (ref 65–99)
Potassium: 4.2 mmol/L (ref 3.5–5.1)
Sodium: 139 mmol/L (ref 135–145)
Total Bilirubin: 0.7 mg/dL (ref 0.3–1.2)
Total Protein: 6.8 g/dL (ref 6.5–8.1)

## 2015-11-30 LAB — LIPID PANEL
Cholesterol: 288 mg/dL — ABNORMAL HIGH (ref 0–200)
HDL: 65 mg/dL
LDL Cholesterol: 210 mg/dL — ABNORMAL HIGH (ref 0–99)
Total CHOL/HDL Ratio: 4.4 ratio
Triglycerides: 67 mg/dL
VLDL: 13 mg/dL (ref 0–40)

## 2015-11-30 LAB — CBC WITH DIFFERENTIAL/PLATELET
Basophils Absolute: 0 K/uL (ref 0–0.1)
Basophils Relative: 1 %
Eosinophils Absolute: 0.3 K/uL (ref 0–0.7)
Eosinophils Relative: 5 %
HCT: 43.3 % (ref 40.0–52.0)
Hemoglobin: 15.4 g/dL (ref 13.0–18.0)
Lymphocytes Relative: 24 %
Lymphs Abs: 1.4 K/uL (ref 1.0–3.6)
MCH: 31.9 pg (ref 26.0–34.0)
MCHC: 35.6 g/dL (ref 32.0–36.0)
MCV: 89.6 fL (ref 80.0–100.0)
Monocytes Absolute: 0.5 K/uL (ref 0.2–1.0)
Monocytes Relative: 9 %
Neutro Abs: 3.8 K/uL (ref 1.4–6.5)
Neutrophils Relative %: 61 %
Platelets: 252 K/uL (ref 150–440)
RBC: 4.84 MIL/uL (ref 4.40–5.90)
RDW: 13.1 % (ref 11.5–14.5)
WBC: 6.1 K/uL (ref 3.8–10.6)

## 2015-11-30 LAB — TSH: TSH: 0.857 u[IU]/mL (ref 0.350–4.500)

## 2015-12-01 LAB — HIV ANTIBODY (ROUTINE TESTING W REFLEX): HIV Screen 4th Generation wRfx: NONREACTIVE

## 2015-12-01 LAB — HEPATITIS C ANTIBODY

## 2015-12-01 LAB — HEMOGLOBIN A1C
HEMOGLOBIN A1C: 5.5 % (ref 4.8–5.6)
Mean Plasma Glucose: 111 mg/dL

## 2015-12-04 ENCOUNTER — Telehealth: Payer: Self-pay | Admitting: Family Medicine

## 2015-12-04 NOTE — Telephone Encounter (Signed)
Inconsistent UDS Marijuana and alcohol present I called patient to discuss and see if he wanted help I reached voicemail, left msg that I would like to talk with him about his urine test I am here to help him, look forward to talking to him soon; please call me --------------------------------------- I'll relay these to him when he calls if interested:  Drug rehabilitation centers are listed below:  [facility] [address],  [place], [place] [address] Tel.[phone]  [facility] [address], [place], [place] [address] Tel.: [phone]      [facility] [place], [place], [place] [address] Tel.[phone]  [facility] [address] [place], [place] [address] Tel.: [phone]      [facility] [address], [place], [place] [address] Tel.: [phone]  [place],  [address] Tel.: [phone]

## 2015-12-05 ENCOUNTER — Ambulatory Visit: Payer: BC Managed Care – PPO | Admitting: Family Medicine

## 2015-12-06 ENCOUNTER — Encounter: Payer: Self-pay | Admitting: Family Medicine

## 2015-12-18 ENCOUNTER — Encounter: Payer: Self-pay | Admitting: Family Medicine

## 2015-12-18 NOTE — Telephone Encounter (Signed)
I left another message; no name, just identified myself, asked person to call back to [facility]; will send letter since I've not been able to reach him and he's not called back

## 2016-02-21 ENCOUNTER — Ambulatory Visit: Payer: BC Managed Care – PPO | Admitting: Family Medicine

## 2016-02-25 ENCOUNTER — Ambulatory Visit: Payer: BC Managed Care – PPO | Admitting: Family Medicine

## 2016-02-27 ENCOUNTER — Other Ambulatory Visit: Payer: Self-pay

## 2016-02-27 DIAGNOSIS — B001 Herpesviral vesicular dermatitis: Secondary | ICD-10-CM

## 2016-02-27 NOTE — Telephone Encounter (Signed)
Request received for valacyclovir There is no diagnosis of herpes (oral or genital) in the chart, so I need to verify dose and instructions (they are different for different locations) Please find out from patient if his herpes outbreaks are for oral lesions or genital lesions, and also how many outbreaks does he usually get per year Thank you

## 2016-02-28 ENCOUNTER — Other Ambulatory Visit: Payer: Self-pay

## 2016-02-28 DIAGNOSIS — B001 Herpesviral vesicular dermatitis: Secondary | ICD-10-CM

## 2016-02-28 DIAGNOSIS — E785 Hyperlipidemia, unspecified: Secondary | ICD-10-CM

## 2016-02-28 HISTORY — DX: Herpesviral vesicular dermatitis: B00.1

## 2016-02-28 MED ORDER — VALACYCLOVIR HCL 1 G PO TABS: ORAL_TABLET | ORAL | 5 refills | Status: DC

## 2016-02-28 NOTE — Telephone Encounter (Signed)
Last labs reviewed It appears that they went to Dr. Rutherford Nail instead of to me so these have apparently not been reviewed LDL much too high Significant alcohol in his last UDS, as well as marijuana It is late on a Friday night, so I will call patient another time to discuss labs and strategy going forward with his lipids, substances

## 2016-02-28 NOTE — Telephone Encounter (Signed)
Thank you New Rx sent Dx added to problem list and med history

## 2016-02-28 NOTE — Telephone Encounter (Signed)
He states oral and has about 3 break outs a year

## 2016-03-02 MED ORDER — ATORVASTATIN CALCIUM 20 MG PO TABS: 20.0000 mg | ORAL_TABLET | Freq: Every day | ORAL | 0 refills | Status: DC

## 2016-03-02 NOTE — Telephone Encounter (Signed)
Patient has an appt in two days; will Rx a few and then talk with him about test results, dose adjustment, etc at his visit

## 2016-03-04 ENCOUNTER — Encounter: Payer: Self-pay | Admitting: Family Medicine

## 2016-03-04 ENCOUNTER — Ambulatory Visit (INDEPENDENT_AMBULATORY_CARE_PROVIDER_SITE_OTHER): Payer: BC Managed Care – PPO | Admitting: Family Medicine

## 2016-03-04 DIAGNOSIS — B001 Herpesviral vesicular dermatitis: Secondary | ICD-10-CM | POA: Diagnosis not present

## 2016-03-04 DIAGNOSIS — E785 Hyperlipidemia, unspecified: Secondary | ICD-10-CM | POA: Diagnosis not present

## 2016-03-04 DIAGNOSIS — F902 Attention-deficit hyperactivity disorder, combined type: Secondary | ICD-10-CM | POA: Diagnosis not present

## 2016-03-04 DIAGNOSIS — Z5181 Encounter for therapeutic drug level monitoring: Secondary | ICD-10-CM

## 2016-03-04 MED ORDER — VALACYCLOVIR HCL 1 G PO TABS: ORAL_TABLET | ORAL | 11 refills | Status: DC

## 2016-03-04 MED ORDER — ATORVASTATIN CALCIUM 20 MG PO TABS: 20.0000 mg | ORAL_TABLET | Freq: Every day | ORAL | 1 refills | Status: DC

## 2016-03-04 NOTE — Assessment & Plan Note (Signed)
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

## 2016-03-04 NOTE — Patient Instructions (Addendum)
Start back on 20 mg of atorvastatin at bedtime every night Recheck fasting labs six weeks after getting back on this medicine Try to limit saturated fats in your diet (bologna, hot dogs, barbeque, cheeseburgers, hamburgers, steak, bacon, sausage, cheese, etc.) and get more fresh fruits, vegetables, and whole grains Please have labs done around April 18, 2016 Check out the book Answers to Distraction by [place] and [place] = only handle it once I am here when you need me

## 2016-03-04 NOTE — Assessment & Plan Note (Addendum)
Start back on statin; avoid sat fats; recheck labs in 6 weeks; discussed inherited role, given that his LDL is over 190; risk of atherosclerosis

## 2016-03-04 NOTE — Assessment & Plan Note (Signed)
Check sgpt in 6 weeks 

## 2016-03-04 NOTE — Progress Notes (Signed)
BP 118/60   Pulse 98   Temp 98.8 F (37.1 C) (Oral)   Resp 14   Wt 171 lb (77.6 kg)   SpO2 96%   BMI 29.35 kg/m    Subjective:    Patient ID: Adam Ryan, male    DOB: 1960/06/20, 55 y.o.   MRN: [id number]  HPI: Adam Ryan is a 55 y.o. male  Chief Complaint  Patient presents with  . Follow-up  . 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 Walker Kehr desktop; the patient never heard back about them, and thought everything must have been fine; I shared with him that I personally have a policy that no news does not mean good news, and to please call the office if he hasn't heard back about a test, xray, or referral within a week; his labs included a mildly low calcium (8.7); no hx of low vit D; he actually avoids excessive sun b/c of rosacea; sunshine flares that up  We reviewed his glucose; it was 103 on 11/30/2015; his father had type 2 DM in his early 37s  High cholesterol; inherited; not the best diet but not horrible; on medicines on previously; no problems witih medicines Total cholesterol was 288, TG 67, HDL 65, LDL 210  He gets recurrent HSV on the face  Depression screen [facility] 2/9 03/04/2016 11/26/2015 02/25/2015 11/26/2014 09/24/2014  Decreased Interest 0 0 0 0 0  Down, Depressed, Hopeless 0 3 0 0 0  PHQ - 2 Score 0 3 0 0 0  Altered sleeping - 0 - - -  Tired, decreased energy - 1 - - -  Change in appetite - 2 - - -  Feeling bad or failure about yourself  - 3 - - -  Trouble concentrating - 3 - - -  Moving slowly or  fidgety/restless - 1 - - -  Suicidal thoughts - 0 - - -  PHQ-9 Score - 13 - - -  Difficult doing work/chores - Very difficult - - -    Relevant past medical, surgical, family and social history reviewed Past Medical History:  Diagnosis Date  . ADD (attention deficit disorder)   . Fever blister 02/28/2016  . Hyperlipidemia    Past Surgical History:  Procedure Laterality Date  . WISDOM TOOTH EXTRACTION  1981   Family History  Problem Relation Age of Onset  . Cancer Father     bladder  . Asthma Father    Social History  Substance Use Topics  . Smoking status: Former Smoker    Types: Cigars  . Smokeless tobacco: Never Used  . Alcohol use 1.8 oz/week    3 Standard drinks or equivalent per week   Interim medical history since last visit reviewed. Allergies and medications reviewed  Review of Systems Per HPI unless specifically indicated above     Objective:    BP 118/60   Pulse 98   Temp 98.8 F (37.1 C) (Oral)   Resp 14  Wt 171 lb (77.6 kg)   SpO2 96%   BMI 29.35 kg/m   Wt Readings from Last 3 Encounters:  03/04/16 171 lb (77.6 kg)  11/26/15 171 lb 4.8 oz (77.7 kg)  02/25/15 163 lb 9.6 oz (74.2 kg)    Physical Exam  Constitutional: He appears well-developed and well-nourished. No distress.  HENT:  Head: Normocephalic and atraumatic.  Eyes: EOM are normal. No scleral icterus.  Neck: No thyromegaly present.  Cardiovascular: Normal rate and regular rhythm.   Pulmonary/Chest: Effort normal and breath sounds normal.  Abdominal: Soft. He exhibits no distension. There is no tenderness.  Musculoskeletal: He exhibits no edema.  Neurological: He is alert. He displays no tremor.  No tics  Skin: Skin is warm and dry. He is not diaphoretic. No pallor.  Psychiatric: He has a normal mood and affect. His behavior is normal. Judgment and thought content normal. His mood appears not anxious. He is not hyperactive. He does not exhibit a depressed mood.  Good eye contact  with examiner; good insight, judgment intact He is attentive.   Results for orders placed or performed during the hospital encounter of 11/30/15  HIV antibody  Result Value Ref Range   HIV Screen 4th Generation wRfx Non Reactive Non Reactive  Hepatitis C antibody  Result Value Ref Range   HCV Ab <0.1 0.0 - 0.9 s/co ratio  Hemoglobin A1c  Result Value Ref Range   Hgb A1c MFr Bld 5.5 4.8 - 5.6 %   Mean Plasma Glucose 111 mg/dL  Lipid panel  Result Value Ref Range   Cholesterol 288 (H) 0 - 200 mg/dL   Triglycerides 67 <150 mg/dL   HDL 65 >40 mg/dL   Total CHOL/HDL Ratio 4.4 RATIO   VLDL 13 0 - 40 mg/dL   LDL Cholesterol 210 (H) 0 - 99 mg/dL  CBC with Differential/Platelet  Result Value Ref Range   WBC 6.1 3.8 - 10.6 K/uL   RBC 4.84 4.40 - 5.90 MIL/uL   Hemoglobin 15.4 13.0 - 18.0 g/dL   HCT 43.3 40.0 - 52.0 %   MCV 89.6 80.0 - 100.0 fL   MCH 31.9 26.0 - 34.0 pg   MCHC 35.6 32.0 - 36.0 g/dL   RDW 13.1 11.5 - 14.5 %   Platelets 252 150 - 440 K/uL   Neutrophils Relative % 61 %   Neutro Abs 3.8 1.4 - 6.5 K/uL   Lymphocytes Relative 24 %   Lymphs Abs 1.4 1.0 - 3.6 K/uL   Monocytes Relative 9 %   Monocytes Absolute 0.5 0.2 - 1.0 K/uL   Eosinophils Relative 5 %   Eosinophils Absolute 0.3 0 - 0.7 K/uL   Basophils Relative 1 %   Basophils Absolute 0.0 0 - 0.1 K/uL  TSH  Result Value Ref Range   TSH 0.857 0.350 - 4.500 uIU/mL  Comprehensive metabolic panel  Result Value Ref Range   Sodium 139 135 - 145 mmol/L   Potassium 4.2 3.5 - 5.1 mmol/L   Chloride 104 101 - 111 mmol/L   CO2 29 22 - 32 mmol/L   Glucose, Bld 103 (H) 65 - 99 mg/dL   BUN 24 (H) 6 - 20 mg/dL   Creatinine, Ser 1.08 0.61 - 1.24 mg/dL   Calcium 8.7 (L) 8.9 - 10.3 mg/dL   Total Protein 6.8 6.5 - 8.1 g/dL   Albumin 4.1 3.5 - 5.0 g/dL   AST 24 15 - 41 U/L   ALT 32 17 - 63 U/L  Alkaline Phosphatase 70 38 - 126 U/L   Total Bilirubin 0.7 0.3 - 1.2 mg/dL   GFR calc non Af Amer >60 >60 mL/min   GFR calc Af Amer  >60 >60 mL/min   Anion gap 6 5 - 15      Assessment & Plan:   Problem List Items Addressed This Visit      Digestive   Fever blister    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; use valacyclovir; consider L-lysine and B complex vitamins      Relevant Medications   valACYclovir (VALTREX) 1000 MG tablet     Other   Medication monitoring encounter    Check sgpt in 6 weeks      Relevant Orders   ALT   Hyperlipidemia    Start back on statin; avoid sat fats; recheck labs in 6 weeks; discussed inherited role, given that his LDL is over 190; risk of atherosclerosis      Relevant Medications   atorvastatin (LIPITOR) 20 MG tablet   aspirin EC 81 MG tablet   Other Relevant Orders   Lipid panel   ADD (attention deficit disorder)    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  An after-visit summary was printed and given to the patient at [facility].  Please see the patient instructions which may contain other information and recommendations beyond what is mentioned above in the assessment and plan.  Meds ordered this encounter  Medications  . minocycline (MINOCIN,DYNACIN) 50 MG capsule  . atorvastatin (LIPITOR) 20 MG tablet    Sig: Take 1 tablet (20 mg total) by mouth daily.    Dispense:  90 tablet    Refill:  1  . valACYclovir (VALTREX) 1000 MG tablet    Sig: Two by mouth at onset of fever blister, then two more pills twelve hours later; four pills total per outbreak    Dispense:  8 tablet    Refill:  11  . aspirin EC 81 MG tablet    Sig: Take 1 tablet (81 mg total) by mouth daily.    Orders Placed This Encounter  Procedures  .  Lipid panel  . ALT   Face-to-face time with patient was more than 25 minutes, >50% time spent counseling and coordination of care

## 2016-03-20 ENCOUNTER — Ambulatory Visit: Payer: BC Managed Care – PPO | Admitting: Family Medicine

## 2016-03-22 NOTE — Assessment & Plan Note (Addendum)
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

## 2016-06-20 ENCOUNTER — Other Ambulatory Visit
Admission: RE | Admit: 2016-06-20 | Discharge: 2016-06-20 | Disposition: A | Discharge status: Home / Self Care | Payer: BC Managed Care – PPO | Source: Ambulatory Visit | Attending: Family Medicine | Admitting: Family Medicine

## 2016-06-20 DIAGNOSIS — Z5181 Encounter for therapeutic drug level monitoring: Secondary | ICD-10-CM | POA: Diagnosis present

## 2016-06-20 DIAGNOSIS — E785 Hyperlipidemia, unspecified: Secondary | ICD-10-CM | POA: Insufficient documentation

## 2016-06-20 LAB — ALT: ALT: 36 U/L (ref 17–63)

## 2016-06-20 LAB — LIPID PANEL
CHOL/HDL RATIO: 3.1 ratio
CHOLESTEROL: 218 mg/dL — AB (ref 0–200)
HDL: 70 mg/dL (ref >40)
LDL Cholesterol: 129 mg/dL — ABNORMAL HIGH (ref 0–99)
TRIGLYCERIDES: 95 mg/dL (ref <150)
VLDL: 19 mg/dL (ref 0–40)

## 2016-06-24 ENCOUNTER — Other Ambulatory Visit: Payer: Self-pay | Admitting: Family Medicine

## 2016-09-02 ENCOUNTER — Other Ambulatory Visit: Payer: Self-pay | Admitting: Family Medicine

## 2016-09-02 DIAGNOSIS — E785 Hyperlipidemia, unspecified: Secondary | ICD-10-CM

## 2016-09-02 NOTE — Telephone Encounter (Signed)
Last sgpt and lipids reviewed; approved

## 2017-02-26 ENCOUNTER — Other Ambulatory Visit: Payer: Self-pay | Admitting: Family Medicine

## 2017-02-26 DIAGNOSIS — E785 Hyperlipidemia, unspecified: Secondary | ICD-10-CM

## 2017-02-26 NOTE — Telephone Encounter (Signed)
Appointment please I'll send limited Rx, but his last visit was December 2017 and we'll want to see him and do labs for further refills Thank you

## 2017-02-26 NOTE — Telephone Encounter (Signed)
LVM for pt to call and schedule an appt °

## 2017-03-02 ENCOUNTER — Other Ambulatory Visit: Payer: Self-pay | Admitting: Family Medicine

## 2017-03-02 DIAGNOSIS — E785 Hyperlipidemia, unspecified: Secondary | ICD-10-CM

## 2017-03-02 NOTE — Telephone Encounter (Signed)
appt needed for further refills; 30 day supply sent last week Phone note already sent to staff to schedule him for an appt

## 2017-03-16 DIAGNOSIS — K635 Polyp of colon: Secondary | ICD-10-CM

## 2017-03-16 HISTORY — DX: Polyp of colon: K63.5

## 2017-03-19 ENCOUNTER — Other Ambulatory Visit: Payer: Self-pay | Admitting: Family Medicine

## 2017-03-19 DIAGNOSIS — E785 Hyperlipidemia, unspecified: Secondary | ICD-10-CM

## 2017-03-19 NOTE — Telephone Encounter (Signed)
Last visit over one year ago See phone note from December Patient needs appt Request denied

## 2017-04-19 ENCOUNTER — Ambulatory Visit: Payer: BC Managed Care – PPO | Admitting: Family Medicine

## 2017-04-19 ENCOUNTER — Encounter: Payer: Self-pay | Admitting: Family Medicine

## 2017-04-19 VITALS — BP 124/74 | HR 80 | Temp 97.8°F | Resp 16 | Ht 64.0 in | Wt 174.6 lb

## 2017-04-19 DIAGNOSIS — Z125 Encounter for screening for malignant neoplasm of prostate: Secondary | ICD-10-CM | POA: Diagnosis not present

## 2017-04-19 DIAGNOSIS — Z0001 Encounter for general adult medical examination with abnormal findings: Secondary | ICD-10-CM

## 2017-04-19 DIAGNOSIS — Z1211 Encounter for screening for malignant neoplasm of colon: Secondary | ICD-10-CM

## 2017-04-19 DIAGNOSIS — E785 Hyperlipidemia, unspecified: Secondary | ICD-10-CM | POA: Diagnosis not present

## 2017-04-19 DIAGNOSIS — L719 Rosacea, unspecified: Secondary | ICD-10-CM

## 2017-04-19 DIAGNOSIS — E663 Overweight: Secondary | ICD-10-CM | POA: Insufficient documentation

## 2017-04-19 DIAGNOSIS — R7303 Prediabetes: Secondary | ICD-10-CM | POA: Diagnosis not present

## 2017-04-19 DIAGNOSIS — F902 Attention-deficit hyperactivity disorder, combined type: Secondary | ICD-10-CM

## 2017-04-19 DIAGNOSIS — H6123 Impacted cerumen, bilateral: Secondary | ICD-10-CM | POA: Diagnosis not present

## 2017-04-19 DIAGNOSIS — B001 Herpesviral vesicular dermatitis: Secondary | ICD-10-CM | POA: Diagnosis not present

## 2017-04-19 DIAGNOSIS — Z0000 Encounter for general adult medical examination without abnormal findings: Secondary | ICD-10-CM

## 2017-04-19 DIAGNOSIS — E782 Mixed hyperlipidemia: Secondary | ICD-10-CM

## 2017-04-19 HISTORY — DX: Rosacea, unspecified: L71.9

## 2017-04-19 MED ORDER — ATORVASTATIN CALCIUM 20 MG PO TABS: 20.0000 mg | ORAL_TABLET | Freq: Every day | ORAL | 3 refills | Status: DC

## 2017-04-19 MED ORDER — VALACYCLOVIR HCL 1 G PO TABS: ORAL_TABLET | ORAL | 8 refills | Status: DC

## 2017-04-19 NOTE — Assessment & Plan Note (Signed)
Refills of valtrex

## 2017-04-19 NOTE — Assessment & Plan Note (Signed)
Seeing psychiatrist next week

## 2017-04-19 NOTE — Patient Instructions (Addendum)
Check out the information at familydoctor.org entitled "Nutrition for Weight Loss: What You Need to Know about Fad Diets" Try to lose between 1-2 pounds per week by taking in fewer calories and burning off more calories You can succeed by limiting portions, limiting foods dense in calories and fat, becoming more active, and drinking 8 glasses of water a day (64 ounces) Don't skip meals, especially breakfast, as skipping meals may alter your metabolism Do not use over-the-counter weight loss pills or gimmicks that claim rapid weight loss A healthy BMI (or body mass index) is between 18.5 and 24.9 You can calculate your ideal BMI at the NIH website ClubMonetize.fr Try to limit saturated fats in your diet (bologna, hot dogs, barbeque, cheeseburgers, hamburgers, steak, bacon, sausage, cheese, etc.) and get more fresh fruits, vegetables, and whole grains  Health Maintenance, Male A healthy lifestyle and preventive care is important for your health and wellness. Ask your health care provider about what schedule of regular examinations is right for you. What should I know about weight and diet? Eat a Healthy Diet  Eat plenty of vegetables, fruits, whole grains, low-fat dairy products, and lean protein.  Do not eat a lot of foods high in solid fats, added sugars, or salt.  Maintain a Healthy Weight Regular exercise can help you achieve or maintain a healthy weight. You should:  Do at least 150 minutes of exercise each week. The exercise should increase your heart rate and make you sweat (moderate-intensity exercise).  Do strength-training exercises at least twice a week.  Watch Your Levels of Cholesterol and Blood Lipids  Have your blood tested for lipids and cholesterol every 5 years starting at 56 years of age. If you are at high risk for heart disease, you should start having your blood tested when you are 56 years old. You may need to have  your cholesterol levels checked more often if: ? Your lipid or cholesterol levels are high. ? You are older than 56 years of age. ? You are at high risk for heart disease.  What should I know about cancer screening? Many types of cancers can be detected early and may often be prevented. Lung Cancer  You should be screened every year for lung cancer if: ? You are a current smoker who has smoked for at least 30 years. ? You are a former smoker who has quit within the past 15 years.  Talk to your health care provider about your screening options, when you should start screening, and how often you should be screened.  Colorectal Cancer  Routine colorectal cancer screening usually begins at 56 years of age and should be repeated every 5-10 years until you are 56 years old. You may need to be screened more often if early forms of precancerous polyps or small growths are found. Your health care provider may recommend screening at an earlier age if you have risk factors for colon cancer.  Your health care provider may recommend using home test kits to check for hidden blood in the stool.  A small camera at the end of a tube can be used to examine your colon (sigmoidoscopy or colonoscopy). This checks for the earliest forms of colorectal cancer.  Prostate and Testicular Cancer  Depending on your age and overall health, your health care provider may do certain tests to screen for prostate and testicular cancer.  Talk to your health care provider about any symptoms or concerns you have about testicular or prostate cancer.  Skin  Cancer  Check your skin from head to toe regularly.  Tell your health care provider about any new moles or changes in moles, especially if: ? There is a change in a mole's size, shape, or color. ? You have a mole that is larger than a pencil eraser.  Always use sunscreen. Apply sunscreen liberally and repeat throughout the day.  Protect yourself by wearing long  sleeves, pants, a wide-brimmed hat, and sunglasses when outside.  What should I know about heart disease, diabetes, and high blood pressure?  If you are 27-37 years of age, have your blood pressure checked every 3-5 years. If you are 37 years of age or older, have your blood pressure checked every year. You should have your blood pressure measured twice-once when you are at a hospital or clinic, and once when you are not at a hospital or clinic. Record the average of the two measurements. To check your blood pressure when you are not at a hospital or clinic, you can use: ? An automated blood pressure machine at a pharmacy. ? A home blood pressure monitor.  Talk to your health care provider about your target blood pressure.  If you are between 74-35 years old, ask your health care provider if you should take aspirin to prevent heart disease.  Have regular diabetes screenings by checking your fasting blood sugar level. ? If you are at a normal weight and have a low risk for diabetes, have this test once every three years after the age of 90. ? If you are overweight and have a high risk for diabetes, consider being tested at a younger age or more often.  A one-time screening for abdominal aortic aneurysm (AAA) by ultrasound is recommended for men aged 3-75 years who are current or former smokers. What should I know about preventing infection? Hepatitis B If you have a higher risk for hepatitis B, you should be screened for this virus. Talk with your health care provider to find out if you are at risk for hepatitis B infection. Hepatitis C Blood testing is recommended for:  Everyone born from [birth year] through 1964.  Anyone with known risk factors for hepatitis C.  Sexually Transmitted Diseases (STDs)  You should be screened each year for STDs including gonorrhea and chlamydia if: ? You are sexually active and are younger than 56 years of age. ? You are older than 56 years of age and your  health care provider tells you that you are at risk for this type of infection. ? Your sexual activity has changed since you were last screened and you are at an increased risk for chlamydia or gonorrhea. Ask your health care provider if you are at risk.  Talk with your health care provider about whether you are at high risk of being infected with HIV. Your health care provider may recommend a prescription medicine to help prevent HIV infection.  What else can I do?  Schedule regular health, dental, and eye exams.  Stay current with your vaccines (immunizations).  Do not use any tobacco products, such as cigarettes, chewing tobacco, and e-cigarettes. If you need help quitting, ask your health care provider.  Limit alcohol intake to no more than 2 drinks per day. One drink equals 12 ounces of beer, 5 ounces of wine, or 1 ounces of hard liquor.  Do not use street drugs.  Do not share needles.  Ask your health care provider for help if you need support or information about quitting drugs.  Tell your health care provider if you often feel depressed.  Tell your health care provider if you have ever been abused or do not feel safe at home. This information is not intended to replace advice given to you by your health care provider. Make sure you discuss any questions you have with your health care provider. Document Released: 08/29/2007 Document Revised: 10/30/2015 Document Reviewed: 12/04/2014 Elsevier Interactive Patient Education  [employer].

## 2017-04-19 NOTE — Assessment & Plan Note (Signed)
encoruaged modest weight loss; for activity, build up slowly and gradually

## 2017-04-19 NOTE — Progress Notes (Signed)
Patient ID: JRUE JARRIEL, male   DOB: 07-Nov-1960, 57 y.o.   MRN: [id number]   Subjective:   Adam Ryan is a 57 y.o. 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; has prediabetes; ran in the family He needs refills of valtrex as well  USPSTF grade A and B recommendations Depression:  Depression screen [facility] 2/9 04/19/2017 03/04/2016 11/26/2015 02/25/2015 11/26/2014  Decreased Interest 0 0 0 0 0  Down, Depressed, Hopeless 1 0 3 0 0  PHQ - 2 Score 1 0 3 0 0  Altered sleeping - - 0 - -  Tired, decreased energy - - 1 - -  Change in appetite - - 2 - -  Feeling bad or failure about yourself  - - 3 - -  Trouble concentrating - - 3 - -  Moving slowly or fidgety/restless - - 1 - -  Suicidal thoughts - - 0 - -  PHQ-9 Score - - 13 - -  Difficult doing work/chores - - Very difficult - -   Hypertension: BP Readings from Last 3 Encounters:  04/19/17 124/74  03/04/16 118/60  11/26/15 118/64   Obesity: Wt Readings from Last 3 Encounters:  04/19/17 174 lb 9.6 oz (79.2 kg)  03/04/16 171 lb (77.6 kg)  11/26/15 171 lb 4.8 oz (77.7 kg)   BMI Readings from Last 3 Encounters:  04/19/17 29.97 kg/m  03/04/16 29.35 kg/m  11/26/15 29.40 kg/m     Skin cancer: nothing worrisome; goes to a derm, rosacea and dermatitis; ;trying to avoid spicy foods and hot drinks; [facility] in [place] Lung cancer:  Smoked cigars, less than 30 pack years Prostate cancer: sees urologist, here; will draw PSA here; no hx of prostate cancer, slightly enlarged; no fam hx of prostate cancer No results found for: PSA Colorectal cancer: had colonoscopy; due in June; five year follow-up AAA: n/a Aspirin: start back; coated 81 mg Diet: lots of fiber Exercise:  walks, not enough Alcohol: yes, under 15 Tobacco use: remote HIV, hep B, hep C: already done STD testing and prevention (chl/gon/syphilis): already done  Lipids: check today; had breakfast, slice of pizza Lab Results  Component Value Date   CHOL 218 (H) 06/20/2016   CHOL 288 (H) 11/30/2015   CHOL 199 09/26/2014   Lab Results  Component Value Date   HDL 70 06/20/2016   HDL 65 11/30/2015   HDL 65 09/26/2014   Lab Results  Component Value Date   LDLCALC 129 (H) 06/20/2016   LDLCALC 210 (H) 11/30/2015   LDLCALC 115 (H) 09/26/2014   Lab Results  Component Value Date   TRIG 95 06/20/2016   TRIG 67 11/30/2015   TRIG 93 09/26/2014   Lab Results  Component Value Date   CHOLHDL 3.1 06/20/2016   CHOLHDL 4.4 11/30/2015   CHOLHDL 3.1 09/26/2014   No results found for: LDLDIRECT Glucose: dough on pizza, no sugary drink Glucose  Date Value Ref Range Status  09/26/2014 107 (H) 65 - 99 mg/dL Final   Glucose, Bld  Date Value Ref Range Status  11/30/2015 103 (H) 65 - 99 mg/dL Final    Past Medical History:  Diagnosis Date  . ADD (attention deficit disorder)   . Fever blister 02/28/2016  .  Hyperlipidemia   . Rosacea 04/19/2017   Past Surgical History:  Procedure Laterality Date  . WISDOM TOOTH EXTRACTION  1981   Family History  Problem Relation Age of Onset  . Parkinson's disease Mother   . Heart disease Mother        open heart surgery, staph infection, died 3 months after surg  . Cancer Father        bladder  . Asthma Father   . Diabetes Father   . Hypertension Father   . Heart disease Paternal Grandfather    Social History   Tobacco Use  . Smoking status: Former Smoker    Types: Cigars  . Smokeless tobacco: Never Used  Substance Use Topics  . Alcohol use: Yes    Alcohol/week: 1.8 oz    Types: 3 Standard drinks or equivalent per week  . Drug use: No   Review of Systems  Constitutional: Negative for unexpected weight change.  HENT: Negative for hearing  loss.   Eyes: Negative for visual disturbance (just went to eye doctor).  Respiratory: Negative for shortness of breath.   Cardiovascular: Negative for chest pain.  Gastrointestinal: Negative for blood in stool.  Endocrine: Negative for polydipsia.  Genitourinary: Negative for hematuria.  Musculoskeletal: Negative for arthralgias.  Neurological: Negative for tremors.  Hematological: Does not bruise/bleed easily.  Psychiatric/Behavioral: Negative for dysphoric mood.    Objective:   Vitals:   04/19/17 0948  BP: 124/74  Pulse: 80  Resp: 16  Temp: 97.8 F (36.6 C)  TempSrc: Oral  SpO2: 93%  Weight: 174 lb 9.6 oz (79.2 kg)  Height: 5\' 4"  (1.626 m)   Body mass index is 29.97 kg/m. Wt Readings from Last 3 Encounters:  04/19/17 174 lb 9.6 oz (79.2 kg)  03/04/16 171 lb (77.6 kg)  11/26/15 171 lb 4.8 oz (77.7 kg)   Physical Exam  Constitutional: He appears well-developed and well-nourished. No distress.  HENT:  Head: Normocephalic and atraumatic.  Right Ear: Tympanic membrane and ear canal normal. Tympanic membrane is not erythematous.  Left Ear: Tympanic membrane and ear canal normal. Tympanic membrane is not erythematous.  Nose: Nose normal. No rhinorrhea.  Mouth/Throat: Oropharynx is clear and moist and mucous membranes are normal.  Both external auditory canals had impacted cerumen; the LEFT side was cleaned out with curette under direct visualization by examiner; the RIGHT was cleaned out partially with curette under direct visualization by examiner, then irrigated, then remainder removed with curette by MD; patient tolerated well  Eyes: EOM are normal. No scleral icterus.  Neck: No JVD present. No thyromegaly present.  Cardiovascular: Normal rate, regular rhythm and normal heart sounds.  Pulmonary/Chest: Effort normal and breath sounds normal. No respiratory distress. He has no wheezes. He has no rales.  Abdominal: Soft. Bowel sounds are normal. He exhibits no distension.  There is no tenderness. There is no guarding.  Musculoskeletal: Normal range of motion. He exhibits no edema.  Lymphadenopathy:    He has no cervical adenopathy.  Neurological: He is alert. He displays normal reflexes. He exhibits normal muscle tone. Coordination normal.  Skin: Skin is warm and dry. No rash noted. He is not diaphoretic. No erythema. No pallor.  Nailbeds are pale relative to examiner  Psychiatric: He has a normal mood and affect. His behavior is normal. Judgment and thought content normal.   Assessment/Plan:   Problem List Items Addressed This Visit      Digestive   Fever blister    Refills of valtrex  Relevant Medications   valACYclovir (VALTREX) 1000 MG tablet     Musculoskeletal and Integument   Rosacea    Managed by derm; he is aware of specific triggers        Other   Prediabetes    Monitor glucose (nonfasting) and A1c      Relevant Orders   Hemoglobin A1c   Overweight (BMI 25.0-29.9)    encoruaged modest weight loss; for activity, build up slowly and gradually      Hyperlipidemia    Check lipids today (nonfasting); limit saturated fats; try to lose weight, become a little more active; refilled the statin      Relevant Medications   atorvastatin (LIPITOR) 20 MG tablet   Annual physical exam - Primary    USPSTF grade A and B recommendations reviewed with patient; age-appropriate recommendations, preventive care, screening tests, etc discussed and encouraged; healthy living encouraged; see AVS for patient education given to patient       Relevant Orders   CBC with Differential/Platelet   COMPLETE METABOLIC PANEL WITH GFR   Lipid panel   TSH   ADD (attention deficit disorder)    Seeing psychiatrist next week       Other Visit Diagnoses    Screen for colon cancer       Relevant Orders   Ambulatory referral to General Surgery   Bilateral impacted cerumen       Screening for prostate cancer       Relevant Orders   PSA      Meds  ordered this encounter  Medications  . valACYclovir (VALTREX) 1000 MG tablet    Sig: TAKE 2 TABLETS BY MOUTH AT ONSET OF FEVER BLISTER, THEN TWO MORE TABS 12HRS LATER PER OUTBREAK    Dispense:  8 tablet    Refill:  8  . atorvastatin (LIPITOR) 20 MG tablet    Sig: Take 1 tablet (20 mg total) by mouth at bedtime.    Dispense:  90 tablet    Refill:  3   Orders Placed This Encounter  Procedures  . CBC with Differential/Platelet  . COMPLETE METABOLIC PANEL WITH GFR  . Hemoglobin A1c  . Lipid panel  . TSH  . PSA  . Ambulatory referral to General Surgery    Referral Priority:   Routine    Referral Type:   Surgical    Referral Reason:   Specialty Services Required    Referred to Provider:   Robert Bellow, MD    Requested Specialty:   General Surgery    Number of Visits Requested:   1    Follow up plan: Return in about 1 year (around 04/19/2018) for complete physical.  An After Visit Summary was printed and given to the patient.

## 2017-04-19 NOTE — Assessment & Plan Note (Signed)
USPSTF grade A and B recommendations reviewed with patient; age-appropriate recommendations, preventive care, screening tests, etc discussed and encouraged; healthy living encouraged; see AVS for patient education given to patient  

## 2017-04-19 NOTE — Assessment & Plan Note (Addendum)
Managed by derm; he is aware of specific triggers

## 2017-04-19 NOTE — Assessment & Plan Note (Signed)
Check lipids today (nonfasting); limit saturated fats; try to lose weight, become a little more active; refilled the statin

## 2017-04-19 NOTE — Assessment & Plan Note (Signed)
Monitor glucose (nonfasting) and A1c

## 2017-04-20 LAB — LIPID PANEL
Cholesterol: 282 mg/dL — ABNORMAL HIGH (ref <200)
HDL: 73 mg/dL (ref >40)
LDL CHOLESTEROL (CALC): 192 mg/dL — AB
Non-HDL Cholesterol (Calc): 209 mg/dL (calc) — ABNORMAL HIGH (ref <130)
TRIGLYCERIDES: 72 mg/dL (ref <150)
Total CHOL/HDL Ratio: 3.9 (calc) (ref <5.0)

## 2017-04-20 LAB — COMPLETE METABOLIC PANEL WITH GFR
AG RATIO: 1.6 (calc) (ref 1.0–2.5)
ALT: 28 U/L (ref 9–46)
AST: 23 U/L (ref 10–35)
Albumin: 4.1 g/dL (ref 3.6–5.1)
Alkaline phosphatase (APISO): 84 U/L (ref 40–115)
BUN: 19 mg/dL (ref 7–25)
CHLORIDE: 102 mmol/L (ref 98–110)
CO2: 29 mmol/L (ref 20–32)
Calcium: 9.3 mg/dL (ref 8.6–10.3)
Creat: 0.94 mg/dL (ref 0.70–1.33)
GFR, EST AFRICAN AMERICAN: 105 mL/min/{1.73_m2} (ref >=60)
GFR, EST NON AFRICAN AMERICAN: 91 mL/min/{1.73_m2} (ref >=60)
GLUCOSE: 106 mg/dL (ref 65–139)
Globulin: 2.6 g/dL (calc) (ref 1.9–3.7)
POTASSIUM: 4.6 mmol/L (ref 3.5–5.3)
Sodium: 137 mmol/L (ref 135–146)
Total Bilirubin: 0.3 mg/dL (ref 0.2–1.2)
Total Protein: 6.7 g/dL (ref 6.1–8.1)

## 2017-04-20 LAB — CBC WITH DIFFERENTIAL/PLATELET
BASOS PCT: 0.7 %
Basophils Absolute: 39 cells/uL (ref 0–200)
EOS PCT: 0.7 %
Eosinophils Absolute: 39 cells/uL (ref 15–500)
HCT: 43.3 % (ref 38.5–50.0)
Hemoglobin: 15.1 g/dL (ref 13.2–17.1)
Lymphs Abs: 1111 cells/uL (ref 850–3900)
MCH: 31.1 pg (ref 27.0–33.0)
MCHC: 34.9 g/dL (ref 32.0–36.0)
MCV: 89.3 fL (ref 80.0–100.0)
MONOS PCT: 6.9 %
MPV: 10 fL (ref 7.5–12.5)
NEUTROS PCT: 71.5 %
Neutro Abs: 3933 cells/uL (ref 1500–7800)
PLATELETS: 306 10*3/uL (ref 140–400)
RBC: 4.85 10*6/uL (ref 4.20–5.80)
RDW: 12.5 % (ref 11.0–15.0)
TOTAL LYMPHOCYTE: 20.2 %
WBC mixed population: 380 cells/uL (ref 200–950)
WBC: 5.5 10*3/uL (ref 3.8–10.8)

## 2017-04-20 LAB — PSA: PSA: 1.4 ng/mL (ref <=4.0)

## 2017-04-20 LAB — HEMOGLOBIN A1C
Hgb A1c MFr Bld: 5.6 % of total Hgb (ref <5.7)
Mean Plasma Glucose: 114 (calc)
eAG (mmol/L): 6.3 (calc)

## 2017-04-20 LAB — TSH: TSH: 0.51 mIU/L (ref 0.40–4.50)

## 2017-06-25 ENCOUNTER — Encounter: Payer: Self-pay | Admitting: *Deleted

## 2017-07-08 ENCOUNTER — Telehealth: Payer: Self-pay | Admitting: Family Medicine

## 2017-07-08 DIAGNOSIS — E782 Mixed hyperlipidemia: Secondary | ICD-10-CM

## 2017-07-08 NOTE — Telephone Encounter (Signed)
Orders placed and released

## 2017-07-08 NOTE — Telephone Encounter (Signed)
Copied from [place]. Topic: Quick Communication - See Telephone Encounter >> Jul 08, 2017  1:59 PM Boyd Kerbs wrote: CRM for notification.    Pt. Called saying was to come in for blood work. Will need order for this.  Please call pt. - he is wanting to come in on Friday 4/26 in the morning if possible.   See Telephone encounter for: 07/08/17.

## 2017-08-30 ENCOUNTER — Encounter: Payer: Self-pay | Admitting: *Deleted

## 2017-09-02 ENCOUNTER — Ambulatory Visit: Payer: BC Managed Care – PPO | Admitting: General Surgery

## 2017-09-02 ENCOUNTER — Encounter: Payer: Self-pay | Admitting: General Surgery

## 2017-09-02 VITALS — BP 120/78 | HR 82 | Resp 15 | Ht 64.0 in | Wt 155.0 lb

## 2017-09-02 DIAGNOSIS — Z1211 Encounter for screening for malignant neoplasm of colon: Secondary | ICD-10-CM

## 2017-09-02 MED ORDER — POLYETHYLENE GLYCOL 3350 17 GM/SCOOP PO POWD: 1.0000 | Freq: Once | ORAL | 0 refills | Status: AC

## 2017-09-02 NOTE — Patient Instructions (Addendum)
Colonoscopy, Adult A colonoscopy is an exam to look at the large intestine.  It is done to check for problems, such as:  Lumps (tumors).  Growths (polyps).  Swelling (inflammation).  Bleeding.  What happens before the procedure? Eating and drinking Follow instructions from your doctor about eating and  drinking.  These instructions may include:  A few days before the procedure - follow a low-fiber diet. ? Avoid nuts. ? Avoid seeds. ? Avoid dried fruit. ? Avoid raw fruits. ? Avoid vegetables.  1-3 days before the procedure - follow a clear liquid diet. Avoid liquids that have red or purple dye. Drink only clear liquids, such as: ? Clear broth or bouillon. ? Black coffee or tea. ? Clear juice. ? Clear soft drinks or sports drinks. ? Gelatin dessert. ? Popsicles.  On the day of the procedure - do not eat or drink anything during the 2 hours before the procedure.  Bowel prep If you were prescribed an oral bowel prep:  Take it as told by your doctor. Starting the day before your procedure, you will need to drink a lot of liquid. The liquid will cause you to poop (have bowel movements) until your poop is almost clear or light green.  If your skin or butt gets irritated from diarrhea, you may: ? Wipe the area with wipes that have medicine in them, such as adult wet wipes with aloe and vitamin E. ? Put something on your skin that soothes the area, such as petroleum jelly.  If you throw up (vomit) while drinking the bowel prep, take a break for up to 60 minutes. Then begin the bowel prep again. If you keep throwing up and you cannot take the bowel prep without throwing up, call your doctor.  General instructions  Ask your doctor about changing or stopping your normal medicines. This is important if you take diabetes medicines or blood thinners.  Plan to have someone take you home from the hospital or clinic. What happens during the procedure?  An IV tube may be put into  one of your veins.  You will be given medicine to help you relax (sedative).  To reduce your risk of infection: ? Your doctors will wash their hands. ? Your anal area will be washed with soap.  You will be asked to lie on your side with your knees bent.  Your doctor will get a long, thin, flexible tube ready. The tube will have a camera and a light on the end.  The tube will be put into your anus.  The tube will be gently put into your large intestine.  Air will be delivered into your large intestine to keep it open. You may feel some pressure or cramping.  The camera will be used to take photos.  A small tissue sample may be removed from your body to be looked at under a microscope (biopsy). If any possible problems are found, the tissue will be sent to a lab for testing.  If small growths are found, your doctor may remove them and have them checked for cancer.  The tube that was put into your anus will be slowly removed. The procedure may vary among doctors and hospitals. What happens after the procedure?  Your doctor will check on you often until the medicines you were given have worn off.  Do not drive for 24 hours after the procedure.  You may have a small amount of blood in your poop.  You may pass gas.  You may have mild cramps or bloating in your belly (abdomen).  It is up to you to get the results of your procedure. Ask your doctor, or the department performing the procedure, when your results will be ready. This information is not intended to replace advice given to you by your health care provider. Make sure you discuss any questions you have with your health care provider. Document Released: 04/04/2010 Document Revised: 01/01/2016 Document Reviewed: 05/14/2015 Elsevier Interactive Patient Education  2017 [employer].   The patient is scheduled for a Colonoscopy at [facility] on 09/22/17. They are aware to call the day before to get their arrival time. Miralax  prescription has been sent into the patient's pharmacy. The patient is aware of date and instructions.

## 2017-09-02 NOTE — Progress Notes (Signed)
Patient ID: Adam Ryan, male   DOB: 07-04-1960, 57 y.o.   MRN: [id number]  Chief Complaint  Patient presents with  . Colonoscopy    HPI Adam Ryan is a 57 y.o. male.  Who presents for a colonoscopy discussion. The last colonoscopy was completed on 09-07-12. Denies any gastrointestinal issues. Bowels move daily and no bleeding noted. He has been working on losing weight.  HPI  Past Medical History:  Diagnosis Date  . ADD (attention deficit disorder)   . Fever blister 02/28/2016  . Hyperlipidemia   . Rosacea 04/19/2017    Past Surgical History:  Procedure Laterality Date  . WISDOM TOOTH EXTRACTION  1981    Family History  Problem Relation Age of Onset  . Parkinson's disease Mother   . Heart disease Mother        open heart surgery, staph infection, died 3 months after surg  . Cancer Father        bladder  . Asthma Father   . Diabetes Father   . Hypertension Father   . Heart disease Paternal Grandfather     Social History Social History   Tobacco Use  . Smoking status: Former Smoker    Types: Cigars  . Smokeless tobacco: Never Used  Substance Use Topics  . Alcohol use: Yes    Alcohol/week: 1.8 oz    Types: 3 Standard drinks or equivalent per week  . Drug use: No    No Known Allergies  Current Outpatient Medications  Medication Sig Dispense Refill  . atorvastatin (LIPITOR) 20 MG tablet Take 1 tablet (20 mg total) by mouth at bedtime. 90 tablet 3  . betamethasone dipropionate (DIPROLENE) 0.05 % cream     . Ciclopirox 1 % shampoo     . clotrimazole-betamethasone (LOTRISONE) cream APPLY EXTERNALLY TO THE AFFECTED AREA TWICE DAILY 30 g 0  . EUCRISA 2 % OINT     . Melatonin 3-10 MG TABS Take 2 mg by mouth daily.    . minocycline (MINOCIN,DYNACIN) 50 MG capsule as needed.     . Multiple Vitamin (MULTIVITAMIN) capsule Take 1 capsule by mouth daily.    . Omega-3 Fatty Acids (FISH OIL PO) Take by mouth.    . valACYclovir (VALTREX) 1000 MG tablet TAKE 2  TABLETS BY MOUTH AT ONSET OF FEVER BLISTER, THEN TWO MORE TABS 12HRS LATER PER OUTBREAK 8 tablet 8  . VYVANSE 50 MG capsule Take 50 mg by mouth daily.   0   No current facility-administered medications for this visit.     Review of Systems Review of Systems  Constitutional: Negative.   Respiratory: Negative.   Cardiovascular: Negative.   Gastrointestinal: Negative.     Blood pressure 120/78, pulse 82, resp. rate 15, height 5\' 4"  (1.626 m), weight 155 lb (70.3 kg).  Physical Exam Physical Exam  Constitutional: He is oriented to person, place, and time. He appears well-developed and well-nourished.  Eyes: Conjunctivae are normal. No scleral icterus.  Neck: Neck supple.  Cardiovascular: Normal rate, regular rhythm and normal heart sounds.  Pulmonary/Chest: Effort normal and breath sounds normal.  Lymphadenopathy:    He has no cervical adenopathy.    He has no axillary adenopathy.  Neurological: He is alert and oriented to person, place, and time.  Skin: Skin is warm and dry.  Psychiatric: He has a normal mood and affect.    Data Reviewed September 06, 2013 colonoscopy findings: PROXIMAL TRANSVERSE COLON POLYP COLD BIOPSY:  - TUBULAR ADENOMA, 3 FRAGMENTS.  -  NEGATIVE FOR HIGH GRADE DYSPLASIA AND MALIGNANCY.   Comprehensive metabolic studies dated April 19, 2017 showed a creatinine of 0.94 with an estimated GFR of 91.  Normal electrolytes.  CBC of the same date showed a hemoglobin of 15.1 with an MCV of 89, white blood cell count of 5500 with a platelet count of 306,000.  Assessment    Candidate for follow-up colonoscopy.    Plan        Colonoscopy with possible biopsy/polypectomy prn: Information regarding the procedure, including its potential risks and complications (including but not limited to perforation of the bowel, which may require emergency surgery to repair, and bleeding) was verbally given to the patient. Educational information regarding lower intestinal  endoscopy was given to the patient. Written instructions for how to complete the bowel prep using Miralax were provided. The importance of drinking ample fluids to avoid dehydration as a result of the prep emphasized.  The patient is scheduled for a Colonoscopy at [facility] on 09/22/17. They are aware to call the day before to get their arrival time. Miralax prescription has been sent into the patient's pharmacy. The patient is aware of date and instructions.   HPI, Physical Exam, Assessment and Plan have been scribed under the direction and in the presence of Robert Bellow, MD Adam Living, Adam Ryan  I have completed the exam and reviewed the above documentation for accuracy and completeness.  I agree with the above.  Haematologist has been used and any errors in dictation or transcription are unintentional.  Hervey Ard, M.D., F.A.C.S.    Adam Ryan Adam Ryan 09/03/2017, 10:39 AM

## 2017-09-22 ENCOUNTER — Other Ambulatory Visit: Payer: Self-pay

## 2017-09-22 ENCOUNTER — Ambulatory Visit: Payer: BC Managed Care – PPO | Admitting: Anesthesiology

## 2017-09-22 ENCOUNTER — Encounter
Admission: RE | Disposition: A | Discharge status: Home / Self Care | Payer: Self-pay | Source: Ambulatory Visit | Attending: General Surgery

## 2017-09-22 ENCOUNTER — Encounter: Payer: Self-pay | Admitting: *Deleted

## 2017-09-22 ENCOUNTER — Ambulatory Visit
Admission: RE | Admit: 2017-09-22 | Discharge: 2017-09-22 | Disposition: A | Discharge status: Home / Self Care | Payer: BC Managed Care – PPO | Source: Ambulatory Visit | Attending: General Surgery | Admitting: General Surgery

## 2017-09-22 DIAGNOSIS — Z87891 Personal history of nicotine dependence: Secondary | ICD-10-CM | POA: Insufficient documentation

## 2017-09-22 DIAGNOSIS — Z79899 Other long term (current) drug therapy: Secondary | ICD-10-CM | POA: Diagnosis not present

## 2017-09-22 DIAGNOSIS — E785 Hyperlipidemia, unspecified: Secondary | ICD-10-CM | POA: Insufficient documentation

## 2017-09-22 DIAGNOSIS — Z1211 Encounter for screening for malignant neoplasm of colon: Secondary | ICD-10-CM | POA: Insufficient documentation

## 2017-09-22 DIAGNOSIS — Z86010 Personal history of colonic polyps: Secondary | ICD-10-CM | POA: Insufficient documentation

## 2017-09-22 DIAGNOSIS — K5730 Diverticulosis of large intestine without perforation or abscess without bleeding: Secondary | ICD-10-CM | POA: Insufficient documentation

## 2017-09-22 HISTORY — PX: COLONOSCOPY WITH PROPOFOL: SHX5780

## 2017-09-22 SURGERY — COLONOSCOPY WITH PROPOFOL
Anesthesia: General

## 2017-09-22 MED ORDER — SODIUM CHLORIDE 0.9 % IV SOLN
INTRAVENOUS | Status: DC
  Administered 2017-09-22: 09:00:00 via INTRAVENOUS

## 2017-09-22 MED ORDER — PROPOFOL 500 MG/50ML IV EMUL
INTRAVENOUS | Status: DC | PRN
  Administered 2017-09-22: 185 ug/kg/min via INTRAVENOUS

## 2017-09-22 MED ORDER — PROPOFOL 10 MG/ML IV BOLUS
INTRAVENOUS | Status: DC | PRN
  Administered 2017-09-22: 40 mg via INTRAVENOUS
  Administered 2017-09-22: 70 mg via INTRAVENOUS

## 2017-09-22 MED ORDER — LIDOCAINE HCL (CARDIAC) PF 100 MG/5ML IV SOSY
PREFILLED_SYRINGE | INTRAVENOUS | Status: DC | PRN
  Administered 2017-09-22: 50 mg via INTRAVENOUS

## 2017-09-22 MED ORDER — PROPOFOL 500 MG/50ML IV EMUL
INTRAVENOUS | Status: AC
  Filled 2017-09-22: qty 50

## 2017-09-22 NOTE — Anesthesia Post-op Follow-up Note (Signed)
Anesthesia QCDR form completed.        

## 2017-09-22 NOTE — Op Note (Signed)
[facility] Patient Name: Adam Ryan Procedure Date: 09/22/2017 10:02 AM MRN: [id number] Account #: [id number] Date of Birth: Feb 14, 1961 Admit Type: Outpatient Age: 57 Room: [facility] ENDO ROOM 1 Gender: Male Note Status: Finalized Procedure:            Colonoscopy Indications:          High risk colon cancer surveillance: Personal history                        of colonic polyps Providers:            Robert Bellow, MD Referring MD:         Arnetha Courser (Referring MD) Medicines:            Monitored Anesthesia Care Complications:        No immediate complications. Procedure:            Pre-Anesthesia Assessment:                       - Prior to the procedure, a History and Physical was                        performed, and patient medications, allergies and                        sensitivities were reviewed. The patient's tolerance of                        previous anesthesia was reviewed.                       - The risks and benefits of the procedure and the                        sedation options and risks were discussed with the                        patient. All questions were answered and informed                        consent was obtained.                       After obtaining informed consent, the colonoscope was                        passed under direct vision. Throughout the procedure,                        the patient's blood pressure, pulse, and oxygen                        saturations were monitored continuously. The                        Colonoscope was introduced through the anus and                        advanced to the the cecum, identified by appendiceal  orifice and ileocecal valve. The colonoscopy was                        performed with ease. The patient tolerated the                        procedure well. The quality of the bowel preparation                        was  excellent. Findings:      A few medium-mouthed diverticula were found in the sigmoid colon.      The retroflexed view of the distal rectum and anal verge was normal and       showed no anal or rectal abnormalities. Impression:           - Diverticulosis in the sigmoid colon.                       - The distal rectum and anal verge are normal on                        retroflexion view.                       - No specimens collected. Recommendation:       - Repeat colonoscopy in 5 years for surveillance. Procedure Code(s):    --- Professional ---                       [phone], Colonoscopy, flexible; diagnostic, including                        collection of specimen(s) by brushing or washing, when                        performed (separate procedure) Diagnosis Code(s):    --- Professional ---                       Z86.010, Personal history of colonic polyps                       K57.30, Diverticulosis of large intestine without                        perforation or abscess without bleeding CPT copyright 2017 American Medical Association. All rights reserved. The codes documented in this report are preliminary and upon coder review may  be revised to meet current compliance requirements. Robert Bellow, MD 09/22/2017 10:26:44 AM This report has been signed electronically. Number of Addenda: 0 Note Initiated On: 09/22/2017 10:02 AM Scope Withdrawal Time: 0 hours 9 minutes 11 seconds  Total Procedure Duration: 0 hours 13 minutes 16 seconds       [facility]

## 2017-09-22 NOTE — Anesthesia Postprocedure Evaluation (Signed)
Anesthesia Post Note  Patient: Adam Ryan  Procedure(s) Performed: COLONOSCOPY WITH PROPOFOL (N/A )  Patient location during evaluation: PACU Anesthesia Type: General Level of consciousness: awake and alert Pain management: pain level controlled Vital Signs Assessment: post-procedure vital signs reviewed and stable Respiratory status: spontaneous breathing, nonlabored ventilation, respiratory function stable and patient connected to nasal cannula oxygen Cardiovascular status: blood pressure returned to baseline and stable Postop Assessment: no apparent nausea or vomiting Anesthetic complications: no     Last Vitals:  Vitals:   09/22/17 0913  BP: 110/79  Pulse: 63  Resp: 16  Temp: (!) 35.1 C  SpO2: 100%    Last Pain:  Vitals:   09/22/17 0913  TempSrc: Tympanic  PainSc: 0-No pain                 Durenda Hurt

## 2017-09-22 NOTE — Anesthesia Procedure Notes (Signed)
Date/Time: 09/22/2017 10:06 AM Performed by: Johnna Acosta, CRNA Pre-anesthesia Checklist: Patient identified, Emergency Drugs available, Suction available, Patient being monitored and Timeout performed Patient Re-evaluated:Patient Re-evaluated prior to induction Oxygen Delivery Method: Nasal cannula Preoxygenation: Pre-oxygenation with 100% oxygen

## 2017-09-22 NOTE — Anesthesia Preprocedure Evaluation (Signed)
Anesthesia Evaluation  Patient identified by MRN, date of birth, ID band Patient awake    Reviewed: Allergy & Precautions, H&P , NPO status , Patient's Chart, lab work & pertinent test results, reviewed documented beta blocker date and time   History of Anesthesia Complications Negative for: history of anesthetic complications  Airway Mallampati: II  TM Distance: >3 FB Neck ROM: full    Dental no notable dental hx. (+) Dental Advidsory Given   Pulmonary neg pulmonary ROS, former smoker,    Pulmonary exam normal        Cardiovascular Exercise Tolerance: Good negative cardio ROS Normal cardiovascular exam(-) dysrhythmias      Neuro/Psych negative neurological ROS  negative psych ROS   GI/Hepatic negative GI ROS, Neg liver ROS,   Endo/Other  negative endocrine ROS  Renal/GU negative Renal ROS  negative genitourinary   Musculoskeletal   Abdominal   Peds  Hematology negative hematology ROS (+)   Anesthesia Other Findings Past Medical History: No date: ADD (attention deficit disorder) 02/28/2016: Fever blister No date: Hyperlipidemia 04/19/2017: Rosacea  Past Surgical History: No date: COLONOSCOPY No date: OPEN REDUCTION INTERNAL FIXATION (ORIF) HAND; Left 1981: WISDOM TOOTH EXTRACTION  BMI    Body Mass Index:  26.61 kg/m     Reproductive/Obstetrics negative OB ROS                             Anesthesia Physical Anesthesia Plan  ASA: II  Anesthesia Plan: General   Post-op Pain Management:    Induction:   PONV Risk Score and Plan:   Airway Management Planned:   Additional Equipment:   Intra-op Plan:   Post-operative Plan:   Informed Consent:   Dental Advisory Given  Plan Discussed with: Anesthesiologist, CRNA and Surgeon  Anesthesia Plan Comments:         Anesthesia Quick Evaluation

## 2017-09-22 NOTE — H&P (Signed)
No change in clinical history or exam.  

## 2017-09-22 NOTE — Transfer of Care (Signed)
Immediate Anesthesia Transfer of Care Note  Patient: Adam Ryan  Procedure(s) Performed: COLONOSCOPY WITH PROPOFOL (N/A )  Patient Location: PACU  Anesthesia Type:General  Level of Consciousness: sedated  Airway & Oxygen Therapy: Patient Spontanous Breathing and Patient connected to nasal cannula oxygen  Post-op Assessment: Report given to RN and Post -op Vital signs reviewed and stable  Post vital signs: Reviewed and stable  Last Vitals:  Vitals Value Taken Time  BP 88/66 09/22/2017 10:33 AM  Temp 36.1 C 09/22/2017 10:33 AM  Pulse 53 09/22/2017 10:33 AM  Resp 16 09/22/2017 10:33 AM  SpO2 98 % 09/22/2017 10:33 AM    Last Pain:  Vitals:   09/22/17 1033  TempSrc: Tympanic  PainSc:          Complications: No apparent anesthesia complications

## 2017-09-26 ENCOUNTER — Encounter: Payer: Self-pay | Admitting: General Surgery

## 2018-04-02 ENCOUNTER — Other Ambulatory Visit: Payer: Self-pay | Admitting: Family Medicine

## 2018-04-02 DIAGNOSIS — E785 Hyperlipidemia, unspecified: Secondary | ICD-10-CM

## 2018-04-04 NOTE — Telephone Encounter (Signed)
Left detailed voicmeial 

## 2018-04-04 NOTE — Telephone Encounter (Signed)
Patient was due to have his cholesterol rechecked in April of 2019 I prescribed a year's worth of cholesterol medicine in Feb 2019, so he should not be out yet We'll ask him to get his labs done this week and we can adjust his medicine if needed

## 2018-04-07 ENCOUNTER — Encounter: Payer: Self-pay | Admitting: Family Medicine

## 2018-04-20 ENCOUNTER — Ambulatory Visit (INDEPENDENT_AMBULATORY_CARE_PROVIDER_SITE_OTHER): Payer: BC Managed Care – PPO | Admitting: Family Medicine

## 2018-04-20 ENCOUNTER — Encounter: Payer: Self-pay | Admitting: Family Medicine

## 2018-04-20 ENCOUNTER — Other Ambulatory Visit (HOSPITAL_COMMUNITY)
Admission: RE | Admit: 2018-04-20 | Discharge: 2018-04-20 | Disposition: A | Discharge status: Home / Self Care | Payer: BC Managed Care – PPO | Source: Ambulatory Visit | Attending: Family Medicine | Admitting: Family Medicine

## 2018-04-20 VITALS — BP 120/78 | HR 86 | Temp 97.8°F | Ht 63.5 in | Wt 148.6 lb

## 2018-04-20 DIAGNOSIS — Z86010 Personal history of colonic polyps: Secondary | ICD-10-CM

## 2018-04-20 DIAGNOSIS — R3911 Hesitancy of micturition: Secondary | ICD-10-CM | POA: Diagnosis not present

## 2018-04-20 DIAGNOSIS — E782 Mixed hyperlipidemia: Secondary | ICD-10-CM

## 2018-04-20 DIAGNOSIS — Z125 Encounter for screening for malignant neoplasm of prostate: Secondary | ICD-10-CM

## 2018-04-20 DIAGNOSIS — H9193 Unspecified hearing loss, bilateral: Secondary | ICD-10-CM | POA: Diagnosis not present

## 2018-04-20 DIAGNOSIS — Z113 Encounter for screening for infections with a predominantly sexual mode of transmission: Secondary | ICD-10-CM

## 2018-04-20 DIAGNOSIS — Z23 Encounter for immunization: Secondary | ICD-10-CM

## 2018-04-20 DIAGNOSIS — Z0000 Encounter for general adult medical examination without abnormal findings: Secondary | ICD-10-CM

## 2018-04-20 DIAGNOSIS — Z1211 Encounter for screening for malignant neoplasm of colon: Secondary | ICD-10-CM

## 2018-04-20 DIAGNOSIS — M65342 Trigger finger, left ring finger: Secondary | ICD-10-CM | POA: Insufficient documentation

## 2018-04-20 DIAGNOSIS — R39198 Other difficulties with micturition: Secondary | ICD-10-CM | POA: Diagnosis not present

## 2018-04-20 DIAGNOSIS — L719 Rosacea, unspecified: Secondary | ICD-10-CM

## 2018-04-20 HISTORY — DX: Trigger finger, left ring finger: M65.342

## 2018-04-20 LAB — HEMOCCULT GUIAC POC 1CARD (OFFICE)
Card #1 Date: 2052020
FECAL OCCULT BLD: NEGATIVE

## 2018-04-20 NOTE — Assessment & Plan Note (Signed)
He is limiting alcohol, seeing dermatologist

## 2018-04-20 NOTE — Assessment & Plan Note (Signed)
Colonoscopy UTD. 

## 2018-04-20 NOTE — Assessment & Plan Note (Signed)
Encouraged him to limit saturated fats; checking lipids today

## 2018-04-20 NOTE — Assessment & Plan Note (Signed)
Offer referral to orthopaedic hand specialist; patient may call if/when ready to address

## 2018-04-20 NOTE — Assessment & Plan Note (Signed)
USPSTF grade A and B recommendations reviewed with patient; age-appropriate recommendations, preventive care, screening tests, etc discussed and encouraged; healthy living encouraged; see AVS for patient education given to patient  

## 2018-04-20 NOTE — Patient Instructions (Addendum)
Try to limit saturated fats in your diet (bologna, hot dogs, barbeque, cheeseburgers, hamburgers, steak, bacon, sausage, cheese, etc.) and get more fresh fruits, vegetables, and whole grains  Please let me know if you'd like to see an orthopaedic hand specialist about your trigger finger  We'll have you see the audiologist to test your hearing  Return in about 3 months for your 2nd Shingrix immunization   Health Maintenance, Male A healthy lifestyle and preventive care is important for your health and wellness. Ask your health care provider about what schedule of regular examinations is right for you. What should I know about weight and diet? Eat a Healthy Diet  Eat plenty of vegetables, fruits, whole grains, low-fat dairy products, and lean protein.  Do not eat a lot of foods high in solid fats, added sugars, or salt.  Maintain a Healthy Weight Regular exercise can help you achieve or maintain a healthy weight. You should:  Do at least 150 minutes of exercise each week. The exercise should increase your heart rate and make you sweat (moderate-intensity exercise).  Do strength-training exercises at least twice a week. Watch Your Levels of Cholesterol and Blood Lipids  Have your blood tested for lipids and cholesterol every 5 years starting at 57 years of age. If you are at high risk for heart disease, you should start having your blood tested when you are 57 years old. You may need to have your cholesterol levels checked more often if: ? Your lipid or cholesterol levels are high. ? You are older than 57 years of age. ? You are at high risk for heart disease. What should I know about cancer screening? Many types of cancers can be detected early and may often be prevented. Lung Cancer  You should be screened every year for lung cancer if: ? You are a current smoker who has smoked for at least 30 years. ? You are a former smoker who has quit within the past 15 years.  Talk to your  health care provider about your screening options, when you should start screening, and how often you should be screened. Colorectal Cancer  Routine colorectal cancer screening usually begins at 57 years of age and should be repeated every 5-10 years until you are 57 years old. You may need to be screened more often if early forms of precancerous polyps or small growths are found. Your health care provider may recommend screening at an earlier age if you have risk factors for colon cancer.  Your health care provider may recommend using home test kits to check for hidden blood in the stool.  A small camera at the end of a tube can be used to examine your colon (sigmoidoscopy or colonoscopy). This checks for the earliest forms of colorectal cancer. Prostate and Testicular Cancer  Depending on your age and overall health, your health care provider may do certain tests to screen for prostate and testicular cancer.  Talk to your health care provider about any symptoms or concerns you have about testicular or prostate cancer. Skin Cancer  Check your skin from head to toe regularly.  Tell your health care provider about any new moles or changes in moles, especially if: ? There is a change in a mole's size, shape, or color. ? You have a mole that is larger than a pencil eraser.  Always use sunscreen. Apply sunscreen liberally and repeat throughout the day.  Protect yourself by wearing long sleeves, pants, a wide-brimmed hat, and sunglasses when  outside. What should I know about heart disease, diabetes, and high blood pressure?  If you are 44-50 years of age, have your blood pressure checked every 3-5 years. If you are 16 years of age or older, have your blood pressure checked every year. You should have your blood pressure measured twice-once when you are at a hospital or clinic, and once when you are not at a hospital or clinic. Record the average of the two measurements. To check your blood  pressure when you are not at a hospital or clinic, you can use: ? An automated blood pressure machine at a pharmacy. ? A home blood pressure monitor.  Talk to your health care provider about your target blood pressure.  If you are between 59-22 years old, ask your health care provider if you should take aspirin to prevent heart disease.  Have regular diabetes screenings by checking your fasting blood sugar level. ? If you are at a normal weight and have a low risk for diabetes, have this test once every three years after the age of 74. ? If you are overweight and have a high risk for diabetes, consider being tested at a younger age or more often.  A one-time screening for abdominal aortic aneurysm (AAA) by ultrasound is recommended for men aged 73-75 years who are current or former smokers. What should I know about preventing infection? Hepatitis B If you have a higher risk for hepatitis B, you should be screened for this virus. Talk with your health care provider to find out if you are at risk for hepatitis B infection. Hepatitis C Blood testing is recommended for:  Everyone born from [birth year] through 1964.  Anyone with known risk factors for hepatitis C. Sexually Transmitted Diseases (STDs)  You should be screened each year for STDs including gonorrhea and chlamydia if: ? You are sexually active and are younger than 57 years of age. ? You are older than 57 years of age and your health care provider tells you that you are at risk for this type of infection. ? Your sexual activity has changed since you were last screened and you are at an increased risk for chlamydia or gonorrhea. Ask your health care provider if you are at risk.  Talk with your health care provider about whether you are at high risk of being infected with HIV. Your health care provider may recommend a prescription medicine to help prevent HIV infection. What else can I do?  Schedule regular health, dental, and eye  exams.  Stay current with your vaccines (immunizations).  Do not use any tobacco products, such as cigarettes, chewing tobacco, and e-cigarettes. If you need help quitting, ask your health care provider.  Limit alcohol intake to no more than 2 drinks per day. One drink equals 12 ounces of beer, 5 ounces of wine, or 1 ounces of hard liquor.  Do not use street drugs.  Do not share needles.  Ask your health care provider for help if you need support or information about quitting drugs.  Tell your health care provider if you often feel depressed.  Tell your health care provider if you have ever been abused or do not feel safe at home. This information is not intended to replace advice given to you by your health care provider. Make sure you discuss any questions you have with your health care provider. Document Released: 08/29/2007 Document Revised: 10/30/2015 Document Reviewed: 12/04/2014 Elsevier Interactive Patient Education  2019 [employer]. Zoster Vaccine, Recombinant  injection What is this medicine? ZOSTER VACCINE (ZOS ter vak SEEN) is used to prevent shingles in adults 57 years old and over. This vaccine is not used to treat shingles or nerve pain from shingles. This medicine may be used for other purposes; ask your health care provider or pharmacist if you have questions. COMMON BRAND NAME(S): [facility] What should I tell my health care provider before I take this medicine? They need to know if you have any of these conditions: -blood disorders or disease -cancer like leukemia or lymphoma -immune system problems or therapy -an unusual or allergic reaction to vaccines, other medications, foods, dyes, or preservatives -pregnant or trying to get pregnant -breast-feeding How should I use this medicine? This vaccine is for injection in a muscle. It is given by a health care professional. Talk to your pediatrician regarding the use of this medicine in children. This medicine is  not approved for use in children. Overdosage: If you think you have taken too much of this medicine contact a poison control center or emergency room at once. NOTE: This medicine is only for you. Do not share this medicine with others. What if I miss a dose? Keep appointments for follow-up (booster) doses as directed. It is important not to miss your dose. Call your doctor or health care professional if you are unable to keep an appointment. What may interact with this medicine? -medicines that suppress your immune system -medicines to treat cancer -steroid medicines like prednisone or cortisone This list may not describe all possible interactions. Give your health care provider a list of all the medicines, herbs, non-prescription drugs, or dietary supplements you use. Also tell them if you smoke, drink alcohol, or use illegal drugs. Some items may interact with your medicine. What should I watch for while using this medicine? Visit your doctor for regular check ups. This vaccine, like all vaccines, may not fully protect everyone. What side effects may I notice from receiving this medicine? Side effects that you should report to your doctor or health care professional as soon as possible: -allergic reactions like skin rash, itching or hives, swelling of the face, lips, or tongue -breathing problems Side effects that usually do not require medical attention (report these to your doctor or health care professional if they continue or are bothersome): -chills -headache -fever -nausea, vomiting -redness, warmth, pain, swelling or itching at site where injected -tiredness This list may not describe all possible side effects. Call your doctor for medical advice about side effects. You may report side effects to FDA at 1-800-FDA-1088. Where should I keep my medicine? This vaccine is only given in a clinic, pharmacy, doctor's office, or other health care setting and will not be stored at  home. NOTE: This sheet is a summary. It may not cover all possible information. If you have questions about this medicine, talk to your doctor, pharmacist, or health care provider.  2019 Elsevier/Gold Standard (2016-10-12 13:20:30)

## 2018-04-20 NOTE — Progress Notes (Signed)
BP 120/78   Pulse 86   Temp 97.8 F (36.6 C)   Ht 5' 3.5" (1.613 m)   Wt 148 lb 9.6 oz (67.4 kg)   SpO2 98%   BMI 25.91 kg/m    Subjective:    Patient ID: Adam Ryan, male    DOB: Apr 28, 1960, 58 y.o.   MRN: [id number]  HPI: Adam Ryan is a 58 y.o. male  Chief Complaint  Patient presents with  . Annual Exam    HPI Here for physical  He has been having trouble urinating; shy bladder; now it takes a long time to empty; does not seem to empty completely; prostate was checked during colonoscopy; current symptoms getting worse over the last year No odor to urine, no burning with urination; no fevers No fam hx of prostate cancer; no blood in the urine Dad had bladder cancer, lived through it  Also has trigger finger on the left hand; he works at [employer], [employer], open boxes; he is left-handed  Rosacea; seeing dermatologist; quit drinking Jan 1st, now just drinks socially with friends  USPSTF grade A and B recommendations Depression:  Depression screen [facility] 2/9 04/20/2018 04/19/2017 03/04/2016 11/26/2015 02/25/2015  Decreased Interest 0 0 0 0 0  Down, Depressed, Hopeless 0 1 0 3 0  PHQ - 2 Score 0 1 0 3 0  Altered sleeping 0 - - 0 -  Tired, decreased energy 0 - - 1 -  Change in appetite 0 - - 2 -  Feeling bad or failure about yourself  0 - - 3 -  Trouble concentrating 0 - - 3 -  Moving slowly or fidgety/restless 0 - - 1 -  Suicidal thoughts 0 - - 0 -  PHQ-9 Score 0 - - 13 -  Difficult doing work/chores Not difficult at all - - Very difficult -   Hypertension: BP Readings from Last 3 Encounters:  04/20/18 120/78  09/22/17 108/77  09/02/17 120/78   Obesity: intentional weight loss; has been motivated to keep losing weight after back on Vyvanse, swimming, stocking shelves, 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; not worried about the weight loss  Wt Readings from Last 3 Encounters:  04/20/18 148 lb 9.6 oz (67.4 kg)  09/22/17 155 lb (70.3 kg)  09/02/17 155 lb (70.3 kg)    BMI Readings from Last 3 Encounters:  04/20/18 25.91 kg/m  09/22/17 26.61 kg/m  09/02/17 26.61 kg/m    Immunizations: flu shot UTD; tetanus UTD; he desires shingrix vaccine  Skin cancer: seeing dermatologist regularly Lung cancer:  Ex-cigar smoker; less than 30 pack years Prostate cancer: check PSA AFTER DRE today Lab Results  Component Value Date   PSA 1.4 04/19/2017   Colorectal cancer: done July 2019; next in five years AAA: n/a Aspirin: taking daily, no bleeding The 10-year ASCVD risk score Mikey Bussing DC Jr., et al., 2013) is: 5.8%   Values used to calculate the score:     Age: 42 years     Sex: Male     Is Non-Hispanic African American: No     Diabetic: No     Tobacco smoker: No     Systolic Blood Pressure: 016 mmHg     Is BP treated: No     HDL Cholesterol: 73 mg/dL     Total Cholesterol: 282 mg/dL  Diet: need to work on that, he admits; not enough fruits and veggies; not too much  Exercise: more active Alcohol: quit drinking by himself, was drinking about every  day, cut down and happy with that; rosacea is triggered by the EtOH   [facility] Visit from 04/20/2018 in [facility]  AUDIT-C Score  2     Tobacco use: former smoker, cigar, way less than 30 pack years HIV, hep B, hep C: check today STD testing and prevention (chl/gon/syphilis): check today Glucose:  Glucose  Date Value Ref Range Status  09/26/2014 107 (H) 65 - 99 mg/dL Final   Glucose, Bld  Date Value Ref Range Status  04/19/2017 106 65 - 139 mg/dL Final    Comment:    .        Non-fasting reference interval .   11/30/2015 103 (H) 65 - 99 mg/dL Final   Lipids:  Lab Results  Component Value Date   CHOL 282 (H) 04/19/2017   CHOL 218 (H) 06/20/2016   CHOL 288 (H) 11/30/2015   Lab Results  Component Value Date   HDL 73 04/19/2017   HDL 70 06/20/2016   HDL 65 11/30/2015   Lab Results  Component Value Date   LDLCALC 192 (H) 04/19/2017   LDLCALC 129 (H) 06/20/2016    LDLCALC 210 (H) 11/30/2015   Lab Results  Component Value Date   TRIG 72 04/19/2017   TRIG 95 06/20/2016   TRIG 67 11/30/2015   Lab Results  Component Value Date   CHOLHDL 3.9 04/19/2017   CHOLHDL 3.1 06/20/2016   CHOLHDL 4.4 11/30/2015   No results found for: LDLDIRECT     Depression screen [facility] 2/9 04/20/2018 04/19/2017 03/04/2016 11/26/2015 02/25/2015  Decreased Interest 0 0 0 0 0  Down, Depressed, Hopeless 0 1 0 3 0  PHQ - 2 Score 0 1 0 3 0  Altered sleeping 0 - - 0 -  Tired, decreased energy 0 - - 1 -  Change in appetite 0 - - 2 -  Feeling bad or failure about yourself  0 - - 3 -  Trouble concentrating 0 - - 3 -  Moving slowly or fidgety/restless 0 - - 1 -  Suicidal thoughts 0 - - 0 -  PHQ-9 Score 0 - - 13 -  Difficult doing work/chores Not difficult at all - - Very difficult -   Fall Risk  04/20/2018 04/19/2017 03/04/2016 11/26/2015 02/25/2015  Falls in the past year? 0 No No No No    Relevant past medical, surgical, family and social history reviewed Past Medical History:  Diagnosis Date  . ADD (attention deficit disorder)   . Fever blister 02/28/2016  . Hyperlipidemia   . Rosacea 04/19/2017   Past Surgical History:  Procedure Laterality Date  . COLONOSCOPY    . COLONOSCOPY WITH PROPOFOL N/A 09/22/2017   Procedure: COLONOSCOPY WITH PROPOFOL;  Surgeon: Robert Bellow, MD;  Location: [facility] ENDOSCOPY;  Service: Endoscopy;  Laterality: N/A;  . OPEN REDUCTION INTERNAL FIXATION (ORIF) HAND Left   . WISDOM TOOTH EXTRACTION  1981   Family History  Problem Relation Age of Onset  . Parkinson's disease Mother   . Heart disease Mother        open heart surgery, staph infection, died 3 months after surg  . Cancer Father        bladder  . Asthma Father   . Diabetes Father   . Hypertension Father   . Heart disease Paternal Grandfather    Social History   Tobacco Use  . Smoking status: Former Smoker    Types: Cigars  . Smokeless tobacco: Never Used  Substance Use  Topics  . Alcohol use: Yes    Alcohol/week: 3.0 standard drinks    Types: 3 Cans of beer per week  . Drug use: No     Office Visit from 04/20/2018 in [facility]  AUDIT-C Score  2     Interim medical history since last visit reviewed. Allergies and medications reviewed  Review of Systems  Constitutional: Negative for unexpected weight change (intentioin).  HENT: Positive for hearing loss (he has been told he has some hearing loss; PE teacher for years, in loud gym, loud music; brother has hearing aids (older brother)).   Eyes:       Wears contacts  Respiratory: Negative for wheezing.   Cardiovascular: Negative for chest pain.  Gastrointestinal: Negative for blood in stool.  Endocrine: Negative for polydipsia and polyuria.       Dry mouth, not drinking enough water  Genitourinary: Positive for decreased urine volume, difficulty urinating and frequency (varies, sometimes can go for a while). Negative for dysuria, flank pain, hematuria, scrotal swelling and testicular pain.  Musculoskeletal:       Trigger finger left ring; left-handed  Skin:       No worrisome moles; sees derm regularly  Allergic/Immunologic: Negative for food allergies.  Neurological: Negative for tremors.  Hematological: Negative for adenopathy. Does not bruise/bleed easily.  Psychiatric/Behavioral: Negative for dysphoric mood.   Per HPI unless specifically indicated above     Objective:    BP 120/78   Pulse 86   Temp 97.8 F (36.6 C)   Ht 5' 3.5" (1.613 m)   Wt 148 lb 9.6 oz (67.4 kg)   SpO2 98%   BMI 25.91 kg/m   Wt Readings from Last 3 Encounters:  04/20/18 148 lb 9.6 oz (67.4 kg)  09/22/17 155 lb (70.3 kg)  09/02/17 155 lb (70.3 kg)    Physical Exam Exam conducted with a chaperone present.  Constitutional:      General: He is not in acute distress.    Appearance: He is well-developed. He is not diaphoretic.     Comments: Weight loss acknowledged  HENT:     Head:  Normocephalic and atraumatic.     Nose: Nose normal.  Eyes:     General: No scleral icterus. Neck:     Thyroid: No thyromegaly.     Vascular: No JVD.  Cardiovascular:     Rate and Rhythm: Normal rate and regular rhythm.     Heart sounds: Normal heart sounds.  Pulmonary:     Effort: Pulmonary effort is normal. No respiratory distress.     Breath sounds: Normal breath sounds. No wheezing or rales.  Abdominal:     General: Bowel sounds are normal. There is no distension.     Palpations: Abdomen is soft.     Tenderness: There is no abdominal tenderness. There is no guarding.  Genitourinary:    Prostate: Enlarged (right side > left). Not tender and no nodules present.     Rectum: No mass or tenderness. Normal anal tone.  Musculoskeletal: Normal range of motion.       Hands:     Comments: Trigger finger left ring finger; no visible Dupuytren's  Lymphadenopathy:     Cervical: No cervical adenopathy.  Skin:    General: Skin is warm and dry.     Coloration: Skin is not pale.     Findings: No bruising.  Neurological:     Mental Status: He is alert.     Motor: No tremor  or abnormal muscle tone.     Coordination: Coordination normal.     Deep Tendon Reflexes: Reflexes normal.     Comments: No cogwheeling  Psychiatric:        Mood and Affect: Mood is not anxious or depressed.        Behavior: Behavior normal.        Thought Content: Thought content normal.        Judgment: Judgment normal.     Results for orders placed or performed in visit on 04/20/18  POCT Occult Blood Stool  Result Value Ref Range   Fecal Occult Blood, POC Negative Negative   Card #1 Date 2052020    Card #2 Fecal Occult Blod, POC     Card #2 Date     Card #3 Fecal Occult Blood, POC     Card #3 Date        Assessment & Plan:   Problem List Items Addressed This Visit      Musculoskeletal and Integument   Trigger ring finger of left hand    Offer referral to orthopaedic hand specialist; patient may  call if/when ready to address      Rosacea    He is limiting alcohol, seeing dermatologist        Other   Preventative health care - Primary    USPSTF grade A and B recommendations reviewed with patient; age-appropriate recommendations, preventive care, screening tests, etc discussed and encouraged; healthy living encouraged; see AVS for patient education given to patient       Relevant Orders   CBC   COMPLETE METABOLIC PANEL WITH GFR   Urinalysis w microscopic + reflex cultur   TSH   Lipid panel   Personal history of colonic polyps    Colonoscopy UTD      Hyperlipidemia    Encouraged him to limit saturated fats; checking lipids today       Other Visit Diagnoses    Screen for STD (sexually transmitted disease)       testing offered, accepted   Relevant Orders   HIV Antibody (routine testing w rflx)   RPR   Hepatitis panel, acute   Urine cytology ancillary only   Prostate cancer screening       PSA collected BEFORE DRE   Relevant Orders   PSA   Bilateral hearing loss, unspecified hearing loss type       refer to audiologist for evaluation   Relevant Orders   Ambulatory referral to Audiology   Urinary hesitancy       DRE today AFTER the PSA collected; refer to urologist   Relevant Orders   Ambulatory referral to Urology   Decreased urine stream       DRE today AFTER the PSA collected; refer to urologist   Relevant Orders   Ambulatory referral to Urology   Encounter for Hemoccult screening       Relevant Orders   POCT Occult Blood Stool (Completed)   Need for shingles vaccine           Follow up plan: Return in about 1 year (around 04/21/2019) for complete physical; 3 months for Shingrix booster with CMA.  An after-visit summary was printed and given to the patient at [place].  Please see the patient instructions which may contain other information and recommendations beyond what is mentioned above in the assessment and plan.  No orders of the defined  types were placed in this encounter.   Orders Placed This  Encounter  Procedures  . Varicella-zoster vaccine IM (Shingrix)  . PSA  . CBC  . COMPLETE METABOLIC PANEL WITH GFR  . HIV Antibody (routine testing w rflx)  . RPR  . Hepatitis panel, acute  . Urinalysis w microscopic + reflex cultur  . TSH  . Lipid panel  . Ambulatory referral to Audiology  . Ambulatory referral to Urology  . POCT Occult Blood Stool

## 2018-04-21 LAB — URINALYSIS W MICROSCOPIC + REFLEX CULTURE
Bacteria, UA: NONE SEEN /HPF
Bilirubin Urine: NEGATIVE
GLUCOSE, UA: NEGATIVE
HYALINE CAST: NONE SEEN /LPF
Hgb urine dipstick: NEGATIVE
Ketones, ur: NEGATIVE
Leukocyte Esterase: NEGATIVE
Nitrites, Initial: NEGATIVE
PH: 5.5 (ref 5.0–8.0)
PROTEIN: NEGATIVE
RBC / HPF: NONE SEEN /HPF (ref 0–2)
SPECIFIC GRAVITY, URINE: 1.017 (ref 1.001–1.03)
Squamous Epithelial / LPF: NONE SEEN /HPF (ref <=5)
WBC, UA: NONE SEEN /HPF (ref 0–5)

## 2018-04-21 LAB — CBC
HCT: 44.9 % (ref 38.5–50.0)
Hemoglobin: 15.5 g/dL (ref 13.2–17.1)
MCH: 32 pg (ref 27.0–33.0)
MCHC: 34.5 g/dL (ref 32.0–36.0)
MCV: 92.6 fL (ref 80.0–100.0)
MPV: 10 fL (ref 7.5–12.5)
PLATELETS: 308 10*3/uL (ref 140–400)
RBC: 4.85 10*6/uL (ref 4.20–5.80)
RDW: 11.9 % (ref 11.0–15.0)
WBC: 5.3 10*3/uL (ref 3.8–10.8)

## 2018-04-21 LAB — RPR: RPR Ser Ql: NONREACTIVE

## 2018-04-21 LAB — LIPID PANEL
CHOL/HDL RATIO: 2.5 (calc) (ref <5.0)
CHOLESTEROL: 158 mg/dL (ref <200)
HDL: 64 mg/dL (ref >=40)
LDL CHOLESTEROL (CALC): 74 mg/dL
NON-HDL CHOLESTEROL (CALC): 94 mg/dL (ref <130)
TRIGLYCERIDES: 112 mg/dL (ref <150)

## 2018-04-21 LAB — COMPLETE METABOLIC PANEL WITH GFR
AG RATIO: 1.6 (calc) (ref 1.0–2.5)
ALKALINE PHOSPHATASE (APISO): 86 U/L (ref 35–144)
ALT: 33 U/L (ref 9–46)
AST: 33 U/L (ref 10–35)
Albumin: 4.1 g/dL (ref 3.6–5.1)
BILIRUBIN TOTAL: 0.4 mg/dL (ref 0.2–1.2)
BUN: 25 mg/dL (ref 7–25)
CALCIUM: 8.9 mg/dL (ref 8.6–10.3)
CHLORIDE: 102 mmol/L (ref 98–110)
CO2: 30 mmol/L (ref 20–32)
Creat: 0.87 mg/dL (ref 0.70–1.33)
GFR, EST NON AFRICAN AMERICAN: 96 mL/min/{1.73_m2} (ref >=60)
GFR, Est African American: 112 mL/min/{1.73_m2} (ref >=60)
GLOBULIN: 2.5 g/dL (ref 1.9–3.7)
Glucose, Bld: 108 mg/dL — ABNORMAL HIGH (ref 65–99)
POTASSIUM: 4.6 mmol/L (ref 3.5–5.3)
SODIUM: 138 mmol/L (ref 135–146)
Total Protein: 6.6 g/dL (ref 6.1–8.1)

## 2018-04-21 LAB — HEPATITIS PANEL, ACUTE
Hep A IgM: NONREACTIVE
Hep B C IgM: NONREACTIVE
Hepatitis B Surface Ag: NONREACTIVE
Hepatitis C Ab: NONREACTIVE
SIGNAL TO CUT-OFF: 0.01 (ref <1.00)

## 2018-04-21 LAB — NO CULTURE INDICATED

## 2018-04-21 LAB — URINE CYTOLOGY ANCILLARY ONLY
CHLAMYDIA, DNA PROBE: NEGATIVE
Neisseria Gonorrhea: NEGATIVE
TRICH (WINDOWPATH): NEGATIVE

## 2018-04-21 LAB — TSH: TSH: 1.43 mIU/L (ref 0.40–4.50)

## 2018-04-21 LAB — HIV ANTIBODY (ROUTINE TESTING W REFLEX): HIV 1&2 Ab, 4th Generation: NONREACTIVE

## 2018-04-21 LAB — PSA: PSA: 1.2 ng/mL (ref <=4.0)

## 2018-05-04 ENCOUNTER — Other Ambulatory Visit: Payer: Self-pay | Admitting: Family Medicine

## 2018-05-12 ENCOUNTER — Ambulatory Visit: Payer: BC Managed Care – PPO | Admitting: Urology

## 2018-05-14 ENCOUNTER — Other Ambulatory Visit: Payer: Self-pay | Admitting: Family Medicine

## 2018-05-14 DIAGNOSIS — E785 Hyperlipidemia, unspecified: Secondary | ICD-10-CM

## 2018-05-15 ENCOUNTER — Encounter: Payer: Self-pay | Admitting: Family Medicine

## 2018-05-15 DIAGNOSIS — M65342 Trigger finger, left ring finger: Secondary | ICD-10-CM

## 2018-05-15 NOTE — Telephone Encounter (Signed)
Lab Results  Component Value Date   CHOL 158 04/20/2018   HDL 64 04/20/2018   LDLCALC 74 04/20/2018   TRIG 112 04/20/2018   CHOLHDL 2.5 04/20/2018   Lab Results  Component Value Date   ALT 33 04/20/2018
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
## 2018-05-16 ENCOUNTER — Encounter: Payer: Self-pay | Admitting: Urology

## 2018-05-16 ENCOUNTER — Ambulatory Visit: Payer: BC Managed Care – PPO | Admitting: Urology

## 2018-05-16 VITALS — BP 139/84 | HR 91 | Ht 63.0 in | Wt 145.0 lb

## 2018-05-16 DIAGNOSIS — N138 Other obstructive and reflux uropathy: Secondary | ICD-10-CM | POA: Diagnosis not present

## 2018-05-16 DIAGNOSIS — R350 Frequency of micturition: Secondary | ICD-10-CM | POA: Diagnosis not present

## 2018-05-16 DIAGNOSIS — N401 Enlarged prostate with lower urinary tract symptoms: Secondary | ICD-10-CM | POA: Diagnosis not present

## 2018-05-16 LAB — BLADDER SCAN AMB NON-IMAGING

## 2018-05-16 MED ORDER — MIRABEGRON ER 25 MG PO TB24: 25.0000 mg | ORAL_TABLET | Freq: Every day | ORAL | 0 refills | Status: DC

## 2018-05-16 MED ORDER — MIRABEGRON ER 50 MG PO TB24: 25.0000 mg | ORAL_TABLET | Freq: Every day | ORAL | 0 refills | Status: DC

## 2018-06-06 ENCOUNTER — Ambulatory Visit: Payer: BC Managed Care – PPO | Admitting: Urology
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
## 2018-06-13 ENCOUNTER — Ambulatory Visit: Payer: BC Managed Care – PPO | Admitting: Urology

## 2018-06-13 ENCOUNTER — Other Ambulatory Visit: Payer: Self-pay

## 2018-06-13 ENCOUNTER — Encounter: Payer: Self-pay | Admitting: Urology

## 2018-06-13 VITALS — BP 143/90 | HR 90 | Ht 64.0 in | Wt 145.0 lb

## 2018-06-13 DIAGNOSIS — N401 Enlarged prostate with lower urinary tract symptoms: Secondary | ICD-10-CM | POA: Diagnosis not present

## 2018-06-13 DIAGNOSIS — N138 Other obstructive and reflux uropathy: Secondary | ICD-10-CM

## 2018-06-13 DIAGNOSIS — R350 Frequency of micturition: Secondary | ICD-10-CM | POA: Diagnosis not present

## 2018-06-13 LAB — BLADDER SCAN AMB NON-IMAGING

## 2018-06-13 MED ORDER — MIRABEGRON ER 50 MG PO TB24: 50.0000 mg | ORAL_TABLET | Freq: Every day | ORAL | 3 refills | Status: DC

## 2018-07-20 ENCOUNTER — Other Ambulatory Visit: Payer: Self-pay

## 2018-07-20 ENCOUNTER — Ambulatory Visit (INDEPENDENT_AMBULATORY_CARE_PROVIDER_SITE_OTHER): Payer: BC Managed Care – PPO

## 2018-07-20 DIAGNOSIS — Z23 Encounter for immunization: Secondary | ICD-10-CM

## 2018-09-13 ENCOUNTER — Ambulatory Visit: Payer: BC Managed Care – PPO | Admitting: Urology
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
## 2018-10-19 ENCOUNTER — Other Ambulatory Visit: Payer: Self-pay

## 2018-10-19 ENCOUNTER — Ambulatory Visit: Payer: BC Managed Care – PPO | Admitting: Urology

## 2018-10-19 ENCOUNTER — Encounter: Payer: Self-pay | Admitting: Urology

## 2018-10-19 VITALS — BP 133/85 | HR 80 | Ht 64.0 in | Wt 147.0 lb

## 2018-10-19 DIAGNOSIS — R350 Frequency of micturition: Secondary | ICD-10-CM | POA: Diagnosis not present

## 2018-10-19 DIAGNOSIS — N138 Other obstructive and reflux uropathy: Secondary | ICD-10-CM

## 2018-10-19 DIAGNOSIS — N401 Enlarged prostate with lower urinary tract symptoms: Secondary | ICD-10-CM

## 2018-10-19 LAB — BLADDER SCAN AMB NON-IMAGING: Scan Result: 62

## 2018-10-26 ENCOUNTER — Encounter: Payer: Self-pay | Admitting: Urology

## 2018-10-26 ENCOUNTER — Other Ambulatory Visit: Payer: Self-pay

## 2018-10-26 ENCOUNTER — Ambulatory Visit: Payer: BC Managed Care – PPO | Admitting: Urology

## 2018-10-26 VITALS — BP 144/88 | HR 79 | Ht 64.0 in | Wt 150.0 lb

## 2018-10-26 DIAGNOSIS — R350 Frequency of micturition: Secondary | ICD-10-CM | POA: Diagnosis not present

## 2018-10-26 MED ORDER — TAMSULOSIN HCL 0.4 MG PO CAPS: 0.4000 mg | ORAL_CAPSULE | Freq: Every day | ORAL | 11 refills | Status: DC

## 2018-10-26 NOTE — Patient Instructions (Signed)
Pelvic Floor Dysfunction  Pelvic floor dysfunction (PFD) is a condition that results when the group of muscles and connective tissues that support the organs in the pelvis (pelvic floor muscles) do not work well. These muscles and their connections form a sling that supports the colon and bladder. In men, these muscles also support the prostate gland. In women, they also support the uterus. PFD causes pelvic floor muscles to be too weak, too tight, or a combination of both. In PFD, muscle movements are not coordinated. This condition may cause bowel or bladder problems. It may also cause pain. What are the causes? This condition may be caused by an injury to the pelvic area or by a weakening of pelvic muscles. This often results from pregnancy and childbirth or other types of strain. In many cases, the exact cause is not known. What increases the risk? The following factors may make you more likely to develop this condition:  Having a condition of chronic bladder tissue inflammation (interstitial cystitis).  Being an older person.  Being overweight.  Radiation treatment for cancer in the pelvic region.  Previous pelvic surgery, such as removal of the uterus (hysterectomy) or prostate gland (prostatectomy). What are the signs or symptoms? Symptoms of this condition vary and may include:  Bladder symptoms, such as: ? Trouble starting urination and emptying the bladder. ? Frequent urinary tract infections. ? Leaking urine when coughing, laughing, or exercising (stress incontinence). ? Having to pass urine urgently or frequently. ? Pain when passing urine.  Bowel symptoms, such as: ? Constipation. ? Urgent or frequent bowel movements. ? Incomplete bowel movements. ? Painful bowel movements. ? Leaking stool or gas.  Unexplained genital or rectal pain.  Genital or rectal muscle spasms.  Low back pain. In women, symptoms of PFD may also include:  A heavy, full, or aching feeling in  the vagina.  A bulge that protrudes into the vagina.  Pain during or after sexual intercourse. How is this diagnosed? This condition may be diagnosed based on:  Your symptoms and medical history.  A physical exam. During the exam, your health care provider may check your pelvic muscles for tightness, spasm, pain, or weakness. This may include a rectal exam and a pelvic exam for women. In some cases, you may have diagnostic tests, such as:  Electrical muscle function tests.  Urine flow testing.  X-ray tests of bowel function.  Ultrasound of the pelvic organs. How is this treated? Treatment for this condition depends on your symptoms. Treatment options include:  Physical therapy. This may include Kegel exercises to help relax or strengthen the pelvic floor muscles.  Biofeedback. This type of therapy provides feedback on how tight your pelvic floor muscles are so that you can learn to control them.  Internal or external massage therapy.  A treatment that involves electrical stimulation of the pelvic floor muscles to help control pain (transcutaneous electrical nerve stimulation, or TENS).  Sound wave therapy (ultrasound) to reduce muscle spasms.  Medicines, such as: ? Muscle relaxants. ? Bladder control medicines. Surgery to reconstruct or support pelvic floor muscles may be an option if other treatments do not help. Follow these instructions at home: Activity  Do your usual activities as told by your health care provider. Ask your health care provider if you should modify any activities.  Do pelvic floor strengthening or relaxing exercises at home as told by your physical therapist. Lifestyle  Maintain a healthy weight.  Eat foods that are high in fiber, such as  beans, whole grains, and fresh fruits and vegetables.  Limit foods that are high in fat and processed sugars, such as fried or sweet foods.  Manage stress with relaxation techniques such as yoga or  meditation. General instructions  If you have problems with leakage: ? Use absorbable pads or wear padded underwear. ? Wash frequently with mild soap. ? Keep your genital and anal area as clean and dry as possible. ? Ask your health care provider if you should try a barrier cream to prevent skin irritation.  Take warm baths to relieve pelvic muscle tension or spasms.  Take over-the-counter and prescription medicines only as told by your health care provider.  Keep all follow-up visits as told by your health care provider. This is important. Contact a health care provider if you:  Are not improving with home care.  Have signs or symptoms of PFD that get worse at home.  Develop new signs or symptoms at home.  Have signs of a urinary tract infection, such as: ? Fever. ? Chills. ? Urinary frequency. ? A burning feeling when urinating.  Have not had a bowel movement in 3 days (constipation). Summary  Pelvic floor dysfunction results when the muscles and connective tissues in your pelvic floor do not work well.  These muscles and their connections form a sling that supports your colon and bladder. In men, these muscles also support the prostate gland. In women, they also support the uterus.  PFD may be caused by an injury to the pelvic area or by a weakening of pelvic muscles.  PFD causes pelvic floor muscles to be too weak, too tight, or a combination of both. Symptoms may vary from person to person.  In most cases, PFD can be treated with physical therapies and medicines. Surgery may be an option if other treatments do not help. This information is not intended to replace advice given to you by your health care provider. Make sure you discuss any questions you have with your health care provider. Document Released: 09/20/2017 Document Revised: 09/20/2017 Document Reviewed: 09/20/2017 Elsevier Patient Education  2020 [employer].
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
## 2018-10-27 LAB — URINALYSIS, COMPLETE
Bilirubin, UA: NEGATIVE
Glucose, UA: NEGATIVE
Ketones, UA: NEGATIVE
Leukocytes,UA: NEGATIVE
Nitrite, UA: NEGATIVE
Protein,UA: NEGATIVE
RBC, UA: NEGATIVE
Specific Gravity, UA: 1.01 (ref 1.005–1.030)
Urobilinogen, Ur: 0.2 mg/dL (ref 0.2–1.0)
pH, UA: 6.5 (ref 5.0–7.5)

## 2018-10-27 LAB — MICROSCOPIC EXAMINATION
Bacteria, UA: NONE SEEN
Epithelial Cells (non renal): NONE SEEN /HPF (ref 0–10)
RBC, Urine: NONE SEEN /HPF (ref 0–2)

## 2018-11-03 ENCOUNTER — Encounter: Payer: Self-pay | Admitting: Physical Therapy

## 2018-11-03 ENCOUNTER — Other Ambulatory Visit: Payer: Self-pay

## 2018-11-03 ENCOUNTER — Ambulatory Visit: Payer: BC Managed Care – PPO | Attending: Urology | Admitting: Physical Therapy

## 2018-11-03 DIAGNOSIS — M4125 Other idiopathic scoliosis, thoracolumbar region: Secondary | ICD-10-CM | POA: Insufficient documentation

## 2018-11-03 DIAGNOSIS — M533 Sacrococcygeal disorders, not elsewhere classified: Secondary | ICD-10-CM | POA: Diagnosis present

## 2018-11-03 DIAGNOSIS — M2170 Unequal limb length (acquired), unspecified site: Secondary | ICD-10-CM | POA: Diagnosis present

## 2018-11-03 NOTE — Patient Instructions (Addendum)
Drink room temp water a cup first thing in the morning before coffee  To cleans your gut. Otherwise coffee is very acidic ato hit your guts first thing in the morning   ___  Wear shoe lift in R shoe  ___ Inhale 1-2 pause Exhale 2-1 pause  Sitting on the toilet with urination at home  ___  Sitting with feet on the ground, do not cross ankles
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
## 2018-11-08 NOTE — Addendum Note (Signed)
Addended by: Jerl Mina on: 11/08/2018 10:40 PM   Modules accepted: Orders

## 2018-11-11 ENCOUNTER — Other Ambulatory Visit: Payer: Self-pay

## 2018-11-11 ENCOUNTER — Ambulatory Visit: Payer: BC Managed Care – PPO | Admitting: Physical Therapy

## 2018-11-11 DIAGNOSIS — M2170 Unequal limb length (acquired), unspecified site: Secondary | ICD-10-CM | POA: Diagnosis not present

## 2018-11-11 DIAGNOSIS — M4125 Other idiopathic scoliosis, thoracolumbar region: Secondary | ICD-10-CM

## 2018-11-11 DIAGNOSIS — M533 Sacrococcygeal disorders, not elsewhere classified: Secondary | ICD-10-CM

## 2018-11-11 NOTE — Patient Instructions (Signed)
Open book    Sit tall, feet and sitting down down not on tailbone    Body scan for relaxation

## 2018-11-11 NOTE — Therapy (Signed)
[place] MAIN [facility] SERVICES [address] [place], [place], [address] Phone: [phone]   Fax:  [phone]  Physical Therapy Treatment  Patient Details  Name: Adam Ryan MRN: [id number] Date of Birth: Dec 02, 1960 Referring Provider (PT): Sninisky   Encounter Date: 11/11/2018  PT End of Session - 11/11/18 1002    Visit Number  2    Number of Visits  10    Date for PT Re-Evaluation  01/12/19    PT Start Time  0900    PT Stop Time  1002    PT Time Calculation (min)  62 min    Activity Tolerance  Patient tolerated treatment well    Behavior During Therapy  [facility] for tasks assessed/performed       Past Medical History:  Diagnosis Date  . ADD (attention deficit disorder)   . Fever blister 02/28/2016  . Hyperlipidemia   . Rosacea 04/19/2017    Past Surgical History:  Procedure Laterality Date  . COLONOSCOPY    . COLONOSCOPY WITH PROPOFOL N/A 09/22/2017   Procedure: COLONOSCOPY WITH PROPOFOL;  Surgeon: Robert Bellow, MD;  Location: [facility] ENDOSCOPY;  Service: Endoscopy;  Laterality: N/A;  . OPEN REDUCTION INTERNAL FIXATION (ORIF) HAND Left   . WISDOM TOOTH EXTRACTION  1981    There were no vitals filed for this visit.  Subjective Assessment - 11/11/18 0908    Subjective  Pt reported the shoe lift R foot has improved his LBP by90%. Pt is walking less like duck, shoulders feel levelled, standing more erect.    Patient Stated Goals  be able to urinate without fear. be able to be confident when he needs to urinate.         [facility] PT Assessment - 11/11/18 1237      Observation/Other Assessments   Observations  shoulder levelled, seated lumped posture      Posture/Postural Control   Posture Comments  limited diaphragm excursion and chest breathing ( post Tx improved)        Palpation   Spinal mobility  increased thoracic mm tightness/ medial scap  ( decreased post Tx) .                Pelvic Floor Special  Questions - 11/11/18 1238    Diastasis Recti  no separation     External Perineal Exam  decreased tenderness at pelvic floor  mm B         [facility] Adult PT Treatment/Exercise - 11/11/18 1237      Neuro Re-ed    Neuro Re-ed Details   cued for diaphragmatic / pelvic floor coordination for less chest breathing.  body scan/ midnfulness training for optimal relaxation of mm / downgrading nervous system,       Manual Therapy   Manual therapy comments  PA mob GradeIII T7 T3, STM/ MWM at thoracic paraspinal/interspinals/ medial scap  B                    PT Long Term Goals - 11/08/18 2205      PT LONG TERM GOAL #1   Title  Pt will decrease his NIH-CPSI score from 42% to < 22% in order to restore pelvic function    Time  10    Period  Weeks    Status  New      PT LONG TERM GOAL #2   Title  Pt will demo equal alignment of iliac crest and less gait deviations with  compliance of shoe lift in order to minimize B heel pain and LBP    Time  4    Period  Weeks    Status  New      PT LONG TERM GOAL #3   Title  Pt will demo decreased abdominal separation from 3 fingers to < 1 fingers in order to minimize overactivity of pelvic floor    Time  6    Period  Weeks    Status  New      PT LONG TERM GOAL #4   Title  Pt will demo no tenderness/ tensions at pelvic floor B across 2 sessions in order to relax pelvic floor for urination in public restrooms    Time  8    Period  Weeks    Status  New      PT LONG TERM GOAL #5   Title  Pt will be compliant with relaxation practices to promote proper pelvic floor lengthening and diaphragmatic excursion in order to retrain bladder    Time  10    Period  Weeks    Status  New      Additional Long Term Goals   Additional Long Term Goals  Yes      PT LONG TERM GOAL #6   Title  Pt will be IND with scoliosis specific HEP in order to minimize LBP            Plan - 11/11/18 1239    Clinical Impression Statement  Pt responded well to last  session with shoe lift. Pelvic girdle and shoulders demo'd equal alignment. Focused on manual Tx and neuromuscular reeducation to promote decreased paraspinal mm tightness/ diaphragmatic / pelvic floor coordination for less chest breathing and body scan/ midnfulness training for optimal relaxation of mm / downgrading nervous system.  Provided education on anterior tilt of pelvis and relaxation for initiating urination and breaking habits to override urge to urinate.  Pt demo'd improved diaphragmatic/pelvic floor lengthening, less slumped posture post Tx. Pt continues to benefit from skilled PT.    Personal Factors and Comorbidities  Comorbidity 2    Comorbidities  ADD, high cholesterol    Examination-Activity Limitations  Toileting    Stability/Clinical Decision Making  Evolving/Moderate complexity    Rehab Potential  Good    PT Frequency  1x / week    PT Duration  Other (comment)   10   PT Treatment/Interventions  Moist Heat;Therapeutic activities;Therapeutic exercise;Gait training;Neuromuscular re-education;Patient/family education;Manual techniques;Joint Manipulations;Energy conservation;Taping    Consulted and Agree with Plan of Care  Patient       Patient will benefit from skilled therapeutic intervention in order to improve the following deficits and impairments:  Abnormal gait, Decreased mobility, Decreased endurance, Decreased coordination, Decreased strength, Decreased safety awareness, Increased muscle spasms, Hypomobility, Impaired flexibility, Improper body mechanics  Visit Diagnosis: Leg length discrepancy  Other idiopathic scoliosis, thoracolumbar region  Sacrococcygeal disorders, not elsewhere classified     Problem List Patient Active Problem List   Diagnosis Date Noted  . Trigger ring finger of left hand 04/20/2018  . Preventative health care 04/20/2018  . Rosacea 04/19/2017  . Overweight (BMI 25.0-29.9) 04/19/2017  . Fever blister 02/28/2016  . Hyperlipidemia  11/26/2015  . Medication monitoring encounter 11/26/2015  . ADD (attention deficit disorder) 02/25/2015  . Annual physical exam 09/24/2014  . Personal history of colonic polyps 09/07/2012    Jerl Mina ,PT, DPT, E-RYT  11/11/2018, 12:41 PM  [place] MAIN  [place], [place], [address] Phone: [phone]   Fax:  [phone]  Name: ALISON GLAS MRN: [id number] Date of Birth: [birth year]

## 2018-11-17 ENCOUNTER — Encounter: Payer: BC Managed Care – PPO | Admitting: Physical Therapy

## 2018-11-18 ENCOUNTER — Other Ambulatory Visit: Payer: Self-pay

## 2018-11-18 ENCOUNTER — Ambulatory Visit: Payer: BC Managed Care – PPO | Attending: Urology | Admitting: Physical Therapy

## 2018-11-18 DIAGNOSIS — M2170 Unequal limb length (acquired), unspecified site: Secondary | ICD-10-CM | POA: Diagnosis present

## 2018-11-18 DIAGNOSIS — R2689 Other abnormalities of gait and mobility: Secondary | ICD-10-CM | POA: Diagnosis not present

## 2018-11-18 DIAGNOSIS — M533 Sacrococcygeal disorders, not elsewhere classified: Secondary | ICD-10-CM | POA: Diagnosis present

## 2018-11-18 DIAGNOSIS — M4125 Other idiopathic scoliosis, thoracolumbar region: Secondary | ICD-10-CM | POA: Insufficient documentation

## 2018-11-18 NOTE — Therapy (Signed)
[place] MAIN [facility] SERVICES [address] [place], [place], [address] Phone: [phone]   Fax:  [phone]  Physical Therapy Treatment  Patient Details  Name: [place] MRN: [id number] Date of Birth: Nov 27, 1960 Referring Provider (PT): Sninisky   Encounter Date: 11/18/2018  PT End of Session - 11/18/18 0950    Visit Number  3    Number of Visits  10    Date for PT Re-Evaluation  01/12/19    PT Start Time  0902    PT Stop Time  0955    PT Time Calculation (min)  53 min    Activity Tolerance  Patient tolerated treatment well    Behavior During Therapy  [facility] for tasks assessed/performed       Past Medical History:  Diagnosis Date  . ADD (attention deficit disorder)   . Fever blister 02/28/2016  . Hyperlipidemia   . Rosacea 04/19/2017    Past Surgical History:  Procedure Laterality Date  . COLONOSCOPY    . COLONOSCOPY WITH PROPOFOL N/A 09/22/2017   Procedure: COLONOSCOPY WITH PROPOFOL;  Surgeon: Robert Bellow, MD;  Location: [facility] ENDOSCOPY;  Service: Endoscopy;  Laterality: N/A;  . OPEN REDUCTION INTERNAL FIXATION (ORIF) HAND Left   . WISDOM TOOTH EXTRACTION  1981    There were no vitals filed for this visit.  Subjective Assessment - 11/18/18 0908    Subjective  Pt notices LBP when he steps wrong butit goes away quickly. o change to urinary urgency. Pt visited his brother and he panicked when he had to urinate at his house. The bathroom was next to the kitchen. Pt waited until no one was around for 30 min. Pt has not been practicing his breathing exerciess and plan to do them more this week.    Patient Stated Goals  be able to urinate without fear. be able to be confident when he needs to urinate.         [facility] PT Assessment - 11/18/18 0943      Coordination   Gross Motor Movements are Fluid and Coordinated  --   excessive cues for no ab oversuse. slower breathing   Fine Motor Movements are Fluid and Coordinated  --    explained role of slowedbreathing for relaxation     Posture/Postural Control   Posture Comments  improved diaphramgatic excursion       Palpation   Spinal mobility  decerased thoracic mm tightness                 Pelvic Floor Special Questions - 11/18/18 [id number]    External Perineal Exam  no tenderness at the pelvic floor mm         [facility] Adult PT Treatment/Exercise - 11/18/18 0943      Therapeutic Activites    Therapeutic Activities  --   simulated relaxation/ breathing with sitting on toilet     Neuro Re-ed    Neuro Re-ed Details   excessive cues for deep core level 1 and 2 to slow down, and less ab overuse                  PT Long Term Goals - 11/08/18 2205      PT LONG TERM GOAL #1   Title  Pt will decrease his NIH-CPSI score from 42% to < 22% in order to restore pelvic function    Time  10    Period  Weeks    Status  New  PT LONG TERM GOAL #2   Title  Pt will demo equal alignment of iliac crest and less gait deviations with compliance of shoe lift in order to minimize B heel pain and LBP    Time  4    Period  Weeks    Status  New      PT LONG TERM GOAL #3   Title  Pt will demo decreased abdominal separation from 3 fingers to < 1 fingers in order to minimize overactivity of pelvic floor    Time  6    Period  Weeks    Status  New      PT LONG TERM GOAL #4   Title  Pt will demo no tenderness/ tensions at pelvic floor B across 2 sessions in order to relax pelvic floor for urination in public restrooms    Time  8    Period  Weeks    Status  New      PT LONG TERM GOAL #5   Title  Pt will be compliant with relaxation practices to promote proper pelvic floor lengthening and diaphragmatic excursion in order to retrain bladder    Time  10    Period  Weeks    Status  New      Additional Long Term Goals   Additional Long Term Goals  Yes      PT LONG TERM GOAL #6   Title  Pt will be IND with scoliosis specific HEP in order to minimize LBP             Plan - 11/18/18 0950    Clinical Impression Statement  Pt continues to demo equal alignment of pelvis with shoe lift. Demo'd today no more tenderness at pelvic floor mm. Progressed to deep core coordination with cues to minimize ab overuse. Progressed to deep core level 2 with cues for slower breathing and proper timing of deep core mm. Simulated sitting on toilet with practice of relaxation and use of paced breathing to overcome anxiety and stress with noise/ sounds/ urinating at other people's homes/ public restrooms. Pt demo'd technique correctly after training. Pt continues to benefits from skilled PT.     Personal Factors and Comorbidities  Comorbidity 2    Comorbidities  ADD, high cholesterol    Examination-Activity Limitations  Toileting    Stability/Clinical Decision Making  Evolving/Moderate complexity    Rehab Potential  Good    PT Frequency  1x / week    PT Duration  Other (comment)   10   PT Treatment/Interventions  Moist Heat;Therapeutic activities;Therapeutic exercise;Gait training;Neuromuscular re-education;Patient/family education;Manual techniques;Joint Manipulations;Energy conservation;Taping    Consulted and Agree with Plan of Care  Patient       Patient will benefit from skilled therapeutic intervention in order to improve the following deficits and impairments:  Abnormal gait, Decreased mobility, Decreased endurance, Decreased coordination, Decreased strength, Decreased safety awareness, Increased muscle spasms, Hypomobility, Impaired flexibility, Improper body mechanics  Visit Diagnosis: Leg length discrepancy  Other idiopathic scoliosis, thoracolumbar region  Sacrococcygeal disorders, not elsewhere classified     Problem List Patient Active Problem List   Diagnosis Date Noted  . Trigger ring finger of left hand 04/20/2018  . Preventative health care 04/20/2018  . Rosacea 04/19/2017  . Overweight (BMI 25.0-29.9) 04/19/2017  . Fever blister  02/28/2016  . Hyperlipidemia 11/26/2015  . Medication monitoring encounter 11/26/2015  . ADD (attention deficit disorder) 02/25/2015  . Annual physical exam 09/24/2014  . Personal history of colonic polyps  09/07/2012    Jerl Mina ,PT, DPT, E-RYT  11/18/2018, 9:54 AM  [place] MAIN [facility] SERVICES [address] [place], [place], [address] Phone: [phone]   Fax:  [phone]  Name: [place] MRN: [id number] Date of Birth: 20-Jan-1961

## 2018-11-18 NOTE — Patient Instructions (Addendum)
[email or web address].brown.net/ Information about urinary retention and know the signs of it   Chronic urinary retention occurs over a long period of time. You can urinate, but your bladder doesn't empty completely. You may not even know you have this condition because you have no symptoms at first. Chronic urinary retention can lead to complications. It's important to see your doctor promptly if you have one or more of the following symptoms:  You feel like you have to urinate frequently, often eight or more times a day.  It's hard to start your urine stream.  Your urine stream is weak or start and stops.  You feel like you need to urinate again right after you finish urinating.  You have to get up several times during the night to urinate.  Urine leaks from your bladder throughout the day.  You have urge incontinence, or the strong feeling you have to urinate immediately followed by the inability to stop yourself from urinating.  You can't tell when your bladder is full.  You have an ongoing mild discomfort or a feeling of fullness in your pelvis/lower abdomen.     Overview  A urinary tract infection (UTI) is an infection in any part of your urinary system - your kidneys, ureters, bladder and urethra. Most infections involve the lower urinary tract - the bladder and the urethra.  Women are at greater risk of developing a UTI than are men. Infection limited to your bladder can be painful and annoying. However, serious consequences can occur if a UTI spreads to your kidneys.  Doctors typically treat urinary tract infections with antibiotics. But you can take steps to reduce your chances of getting a UTI in the first place.  Urinary tract infection care at [place]: [place] more products from [facility] Symptoms Urinary tract infections don't always cause signs and symptoms, but  when they do they may include:  A strong, persistent urge to urinate A burning sensation when urinating Passing frequent, small amounts of urine Urine that appears cloudy Urine that appears red, bright pink or cola-colored - a sign of blood in the urine Strong-smelling urine Pelvic pain, in women - especially in the center of the pelvis and around the area of the pubic bone UTIs may be overlooked or mistaken for other conditions in older adults.   vSpecials.com.pt Types of urinary tract infection Each type of UTI may result in more-specific signs and symptoms, depending on which part of your urinary tract is infected.  Part of urinary tract affected Signs and symptoms Kidneys (acute pyelonephritis)  Upper back and side (flank) pain High fever Shaking and chills Nausea Vomiting Bladder (cystitis)  Pelvic pressure Lower abdomen discomfort Frequent, painful urination Blood in urine Urethra (urethritis)  Burning with urination Discharge   Part of urinary tract affected Signs and symptoms  Kidneys (acute pyelonephritis)  Upper back and side (flank) pain  High fever  Shaking and chills  Nausea  Vomiting  Bladder (cystitis)  Pelvic pressure  Lower abdomen discomfort  Frequent, painful urination  Blood in urine  Urethra (urethritis)  Burning with urination  Discharge

## 2018-11-24 ENCOUNTER — Other Ambulatory Visit: Payer: Self-pay

## 2018-11-24 ENCOUNTER — Ambulatory Visit: Payer: BC Managed Care – PPO | Admitting: Physical Therapy

## 2018-11-24 DIAGNOSIS — M2170 Unequal limb length (acquired), unspecified site: Secondary | ICD-10-CM | POA: Diagnosis not present

## 2018-11-24 DIAGNOSIS — M4125 Other idiopathic scoliosis, thoracolumbar region: Secondary | ICD-10-CM

## 2018-11-24 DIAGNOSIS — M533 Sacrococcygeal disorders, not elsewhere classified: Secondary | ICD-10-CM

## 2018-11-24 NOTE — Therapy (Signed)
[place] MAIN [facility] SERVICES [address] [place], [place], [address] Phone: [phone]   Fax:  [phone]  Physical Therapy Treatment  Patient Details  Name: [place] MRN: [id number] Date of Birth: 10/04/1960 Referring Provider (PT): Sninisky   Encounter Date: 11/24/2018  PT End of Session - 11/24/18 1135    Visit Number  4    Number of Visits  10    Date for PT Re-Evaluation  01/12/19    PT Start Time  1005    PT Stop Time  1100    PT Time Calculation (min)  55 min    Activity Tolerance  Patient tolerated treatment well    Behavior During Therapy  [facility] for tasks assessed/performed       Past Medical History:  Diagnosis Date  . ADD (attention deficit disorder)   . Fever blister 02/28/2016  . Hyperlipidemia   . Rosacea 04/19/2017    Past Surgical History:  Procedure Laterality Date  . COLONOSCOPY    . COLONOSCOPY WITH PROPOFOL N/A 09/22/2017   Procedure: COLONOSCOPY WITH PROPOFOL;  Surgeon: Robert Bellow, MD;  Location: [facility] ENDOSCOPY;  Service: Endoscopy;  Laterality: N/A;  . OPEN REDUCTION INTERNAL FIXATION (ORIF) HAND Left   . WISDOM TOOTH EXTRACTION  1981    There were no vitals filed for this visit.  Subjective Assessment - 11/24/18 1006    Subjective  Pt reported he was able to urinate at a friend's house this past week. He practiced his breathing before going into the bathroom. He visualized the urine stream doing down.  Both these techniques helped.  Pt swims everyday , freestyl, breathing on both sides.    Patient Stated Goals  be able to urinate without fear. be able to be confident when he needs to urinate.         [facility] PT Assessment - 11/24/18 1026      Observation/Other Assessments   Observations  downward facing dog, quadriped all with upper trap overuse       Floor to Stand   Comments  Lowering down with poor knee alignment                    [facility] Adult PT Treatment/Exercise -  11/24/18 1545      Neuro Re-ed    Neuro Re-ed Details   provided excessive cues for shoulder stabilization, less upper trap overuse, technique for back stroke to minimize upper trap / pect overuse and promote diaphragmatic/ pelvic floor function      Manual Therapy   Manual therapy comments  STM/MWM at upper trap/ levator                   PT Long Term Goals - 11/24/18 1550      PT LONG TERM GOAL #1   Title  Pt will decrease his NIH-CPSI score from 42% to < 22% in order to restore pelvic function    Time  10    Period  Weeks    Status  On-going      PT LONG TERM GOAL #2   Title  Pt will demo equal alignment of iliac crest and less gait deviations with compliance of shoe lift in order to minimize B heel pain and LBP    Time  4    Period  Weeks    Status  Achieved      PT LONG TERM GOAL #3   Title  Pt will  demo decreased abdominal separation from 3 fingers to < 1 fingers in order to minimize overactivity of pelvic floor    Time  6    Period  Weeks    Status  On-going      PT LONG TERM GOAL #4   Title  Pt will demo no tenderness/ tensions at pelvic floor B across 2 sessions in order to relax pelvic floor for urination in public restrooms    Time  8    Period  Weeks    Status  Achieved      PT LONG TERM GOAL #5   Title  Pt will be compliant with relaxation practices to promote proper pelvic floor lengthening and diaphragmatic excursion in order to retrain bladder    Time  10    Period  Weeks    Status  On-going      Additional Long Term Goals   Additional Long Term Goals  Yes      PT LONG TERM GOAL #6   Title  Pt will be IND with scoliosis specific HEP in order to minimize LBP    Time  10    Period  Weeks    Status  New      PT LONG TERM GOAL #7   Title  Pt will demo no upper trap tightness, rounded shoulders no cues for scapular stabilization in order to perform  downward facing dog in stand<>floor t/f for HEP on the floor    Time  2    Period  Weeks     Status  New      PT LONG TERM GOAL #8   Title  Pt will report being able to use public restrooms or at friends's homes 100% of the time when he is out in the community in order to promote urinary function    Time  4    Period  Weeks    Status  New    Target Date  12/22/18            Plan - 11/24/18 1544    Clinical Impression Statement Improvements:  Pt was able to use the bathroom at his friend's house with use of breathing and relaxation practices.   Focused today to continue promoting optimal diaphragmatic/ pelvic floor excursion for relaxation and urinary function. Manual Tx helped to decrease upper trap tightness, neuromuscular reeducation for scapular stabilization. Discussed balancing his swim routine with back stroke to minimize rounded shoulders/ upper trap overuse/ promote better posture. Introduced downward facing dog into HEP as pt practiced yoga in the past as part of his mobility HEP. Pt continues to benefit from skilled PT.    Personal Factors and Comorbidities  Comorbidity 2    Comorbidities  ADD, high cholesterol    Examination-Activity Limitations  Toileting    Stability/Clinical Decision Making  Evolving/Moderate complexity    Rehab Potential  Good    PT Frequency  1x / week    PT Duration  Other (comment)   10   PT Treatment/Interventions  Moist Heat;Therapeutic activities;Therapeutic exercise;Gait training;Neuromuscular re-education;Patient/family education;Manual techniques;Joint Manipulations;Energy conservation;Taping    Consulted and Agree with Plan of Care  Patient       Patient will benefit from skilled therapeutic intervention in order to improve the following deficits and impairments:  Abnormal gait, Decreased mobility, Decreased endurance, Decreased coordination, Decreased strength, Decreased safety awareness, Increased muscle spasms, Hypomobility, Impaired flexibility, Improper body mechanics  Visit Diagnosis: Leg length discrepancy  Other  idiopathic scoliosis, thoracolumbar  region  Sacrococcygeal disorders, not elsewhere classified     Problem List Patient Active Problem List   Diagnosis Date Noted  . Trigger ring finger of left hand 04/20/2018  . Preventative health care 04/20/2018  . Rosacea 04/19/2017  . Overweight (BMI 25.0-29.9) 04/19/2017  . Fever blister 02/28/2016  . Hyperlipidemia 11/26/2015  . Medication monitoring encounter 11/26/2015  . ADD (attention deficit disorder) 02/25/2015  . Annual physical exam 09/24/2014  . Personal history of colonic polyps 09/07/2012    Jerl Mina ,PT, DPT, E-RYT  11/24/2018, 3:51 PM  [place] MAIN [facility] SERVICES [address] [place], [place], [address] Phone: [phone]   Fax:  [phone]  Name: TAWHID CORSO MRN: [id number] Date of Birth: [birth year]

## 2018-11-24 NOTE — Patient Instructions (Addendum)
Alternate back stroke with freestyle to lower shoulders and stretch pects mm   __   half kneeling transitions to and from the floor  Add mini standing lunges with hand on dresser or table  20 x reps   Start with ski track stance, step back with heel/ toes turned straight, front knee above ankle without moving as your back knee bends and straightens  Keep pelvis and trunk centered between legs without shifting more to the front legs       Transition from standing to floor:  stand to floor transfer :      _ slow     _ mini squat      _ crawl down     _downward dog  - >  shoulders down and bac-  walk the dog ( knee bents to lengthe hamstrings)      Floor to stand :   downward dog   crawl hands back, butt is back, knees behind toes -> squat  Hands at waist , elbows back, chest lifts   ___   Elbow circles and angel wings after swimming to relax upper traps

## 2018-12-01 ENCOUNTER — Other Ambulatory Visit: Payer: Self-pay

## 2018-12-01 ENCOUNTER — Ambulatory Visit: Payer: BC Managed Care – PPO | Admitting: Physical Therapy

## 2018-12-01 DIAGNOSIS — M533 Sacrococcygeal disorders, not elsewhere classified: Secondary | ICD-10-CM

## 2018-12-01 DIAGNOSIS — M2170 Unequal limb length (acquired), unspecified site: Secondary | ICD-10-CM | POA: Diagnosis not present

## 2018-12-01 DIAGNOSIS — M4125 Other idiopathic scoliosis, thoracolumbar region: Secondary | ICD-10-CM

## 2018-12-01 NOTE — Therapy (Signed)
[place] MAIN [facility] SERVICES [address] [place], [place], [address] Phone: [phone]   Fax:  [phone]  Physical Therapy Treatment  Patient Details  Name: [place] MRN: [id number] Date of Birth: [birth year] Referring Provider (PT): Sninisky   Encounter Date: 12/01/2018  PT End of Session - 12/01/18 1505    Visit Number  5    Number of Visits  10    Date for PT Re-Evaluation  01/12/19    PT Start Time  0945    PT Stop Time  1100    PT Time Calculation (min)  75 min    Activity Tolerance  Patient tolerated treatment well    Behavior During Therapy  [facility] for tasks assessed/performed       Past Medical History:  Diagnosis Date  . ADD (attention deficit disorder)   . Fever blister 02/28/2016  . Hyperlipidemia   . Rosacea 04/19/2017    Past Surgical History:  Procedure Laterality Date  . COLONOSCOPY    . COLONOSCOPY WITH PROPOFOL N/A 09/22/2017   Procedure: COLONOSCOPY WITH PROPOFOL;  Surgeon: Robert Bellow, MD;  Location: [facility] ENDOSCOPY;  Service: Endoscopy;  Laterality: N/A;  . OPEN REDUCTION INTERNAL FIXATION (ORIF) HAND Left   . WISDOM TOOTH EXTRACTION  1981    There were no vitals filed for this visit.  Subjective Assessment - 12/01/18 0946    Subjective  Pt tried backstroke swimming and found it difficult    Patient Stated Goals  be able to urinate without fear. be able to be confident when he needs to urinate.         [facility] PT Assessment - 12/01/18 1449      Other:   Other/ Comments  simulated backstroke: poor technique due to limited ROM with shoulder ext/ thoracic ext , and poor udnerstanding      Palpation   Palpation comment  increased tightness L interspinal/ medial/posterior scap, intercostal posterior L                    [facility] Adult PT Treatment/Exercise - 12/01/18 1040      Therapeutic Activites    Therapeutic Activities  --   simulated backstroke technique training     Neuro  Re-ed    Neuro Re-ed Details   Cued for scapular depression , posterior thorax depression with exhalation to minimzie upper trap overuse       Modalities   Modalities  Moist Heat      Moist Heat Therapy   Number Minutes Moist Heat  10 Minutes    Moist Heat Location  --   thoracic during instruction      Manual Therapy   Manual therapy comments  STM/MWM L interspinal/ medial/posterior scap, intercostal posterior L                   PT Long Term Goals - 11/24/18 1550      PT LONG TERM GOAL #1   Title  Pt will decrease his NIH-CPSI score from 42% to < 22% in order to restore pelvic function    Time  10    Period  Weeks    Status  On-going      PT LONG TERM GOAL #2   Title  Pt will demo equal alignment of iliac crest and less gait deviations with compliance of shoe lift in order to minimize B heel pain and LBP    Time  4  Period  Weeks    Status  Achieved      PT LONG TERM GOAL #3   Title  Pt will demo decreased abdominal separation from 3 fingers to < 1 fingers in order to minimize overactivity of pelvic floor    Time  6    Period  Weeks    Status  On-going      PT LONG TERM GOAL #4   Title  Pt will demo no tenderness/ tensions at pelvic floor B across 2 sessions in order to relax pelvic floor for urination in public restrooms    Time  8    Period  Weeks    Status  Achieved      PT LONG TERM GOAL #5   Title  Pt will be compliant with relaxation practices to promote proper pelvic floor lengthening and diaphragmatic excursion in order to retrain bladder    Time  10    Period  Weeks    Status  On-going      Additional Long Term Goals   Additional Long Term Goals  Yes      PT LONG TERM GOAL #6   Title  Pt will be IND with scoliosis specific HEP in order to minimize LBP    Time  10    Period  Weeks    Status  New      PT LONG TERM GOAL #7   Title  Pt will demo no upper trap tightness, rounded shoulders no cues for scapular stabilization in order to  perform  downward facing dog in stand<>floor t/f for HEP on the floor    Time  2    Period  Weeks    Status  New      PT LONG TERM GOAL #8   Title  Pt will report being able to use public restrooms or at friends's homes 100% of the time when he is out in the community in order to promote urinary function    Time  4    Period  Weeks    Status  New    Target Date  12/22/18            Plan - 12/01/18 1506    Clinical Impression Statement Further improved rounded shoulders to minimize upper trap overuse and optimize diaphragm/pelvic floor expansion. Manual Tx and neuromuscular re-education helped pt decrease L sided tightness along spine and shoulders. Pt demo'd improved shoulder ROM which helped pt improved technique with back stroke. With adding backstroke in his swimming routine, suspect pt will yield long lasting improvements to minimize mm overuse as a regular swimming with freestyle stroke. Pt continues to benefit from skilled PT    Personal Factors and Comorbidities  Comorbidity 2    Comorbidities  ADD, high cholesterol    Examination-Activity Limitations  Toileting    Stability/Clinical Decision Making  Evolving/Moderate complexity    Rehab Potential  Good    PT Frequency  1x / week    PT Duration  Other (comment)   10   PT Treatment/Interventions  Moist Heat;Therapeutic activities;Therapeutic exercise;Gait training;Neuromuscular re-education;Patient/family education;Manual techniques;Joint Manipulations;Energy conservation;Taping    Consulted and Agree with Plan of Care  Patient       Patient will benefit from skilled therapeutic intervention in order to improve the following deficits and impairments:  Abnormal gait, Decreased mobility, Decreased endurance, Decreased coordination, Decreased strength, Decreased safety awareness, Increased muscle spasms, Hypomobility, Impaired flexibility, Improper body mechanics  Visit Diagnosis: Leg length discrepancy  Other idiopathic  scoliosis, thoracolumbar region  Sacrococcygeal disorders, not elsewhere classified     Problem List Patient Active Problem List   Diagnosis Date Noted  . Trigger ring finger of left hand 04/20/2018  . Preventative health care 04/20/2018  . Rosacea 04/19/2017  . Overweight (BMI 25.0-29.9) 04/19/2017  . Fever blister 02/28/2016  . Hyperlipidemia 11/26/2015  . Medication monitoring encounter 11/26/2015  . ADD (attention deficit disorder) 02/25/2015  . Annual physical exam 09/24/2014  . Personal history of colonic polyps 09/07/2012    Jerl Mina ,PT, DPT, E-RYT  12/01/2018, 3:15 PM  [place] MAIN [facility] SERVICES [address] [place], [place], [address] Phone: [phone]   Fax:  [phone]  Name: [place] MRN: [id number] Date of Birth: 02/27/1961

## 2018-12-01 NOTE — Patient Instructions (Signed)
Open book ( handout)   Back stroke and med shoulder blade massage by the door way

## 2018-12-08 ENCOUNTER — Other Ambulatory Visit: Payer: Self-pay

## 2018-12-08 ENCOUNTER — Ambulatory Visit: Payer: BC Managed Care – PPO | Admitting: Physical Therapy

## 2018-12-08 DIAGNOSIS — M2170 Unequal limb length (acquired), unspecified site: Secondary | ICD-10-CM

## 2018-12-08 DIAGNOSIS — M533 Sacrococcygeal disorders, not elsewhere classified: Secondary | ICD-10-CM

## 2018-12-08 DIAGNOSIS — M4125 Other idiopathic scoliosis, thoracolumbar region: Secondary | ICD-10-CM

## 2018-12-08 NOTE — Patient Instructions (Addendum)
Sitting posture: Less slouching/ feel firm  Body language  Smaller pelvic tilts , rocking on sitting bones  ___  When using public restrooms: Be consistent this week with using stalls only, and hold off  using urinals until later.  Practice the small pelvic circles with feet firm all four corners
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
## 2018-12-15 ENCOUNTER — Other Ambulatory Visit: Payer: Self-pay

## 2018-12-15 ENCOUNTER — Ambulatory Visit: Payer: BC Managed Care – PPO | Attending: Urology | Admitting: Physical Therapy

## 2018-12-15 DIAGNOSIS — M533 Sacrococcygeal disorders, not elsewhere classified: Secondary | ICD-10-CM | POA: Insufficient documentation

## 2018-12-15 DIAGNOSIS — M2170 Unequal limb length (acquired), unspecified site: Secondary | ICD-10-CM | POA: Diagnosis present

## 2018-12-15 DIAGNOSIS — M4125 Other idiopathic scoliosis, thoracolumbar region: Secondary | ICD-10-CM | POA: Insufficient documentation

## 2018-12-15 NOTE — Therapy (Signed)
[place] MAIN [facility] SERVICES [address] [place], [place], [address] Phone: [phone]   Fax:  [phone]  Physical Therapy Treatment  Patient Details  Name: Adam Ryan MRN: [id number] Date of Birth: [birth year] Referring Provider (PT): Sninisky   Encounter Date: 12/15/2018  PT End of Session - 12/15/18 1054    Visit Number  7    Number of Visits  10    Date for PT Re-Evaluation  01/12/19    PT Start Time  1000    PT Stop Time  1055    PT Time Calculation (min)  55 min    Activity Tolerance  Patient tolerated treatment well    Behavior During Therapy  [facility] for tasks assessed/performed       Past Medical History:  Diagnosis Date  . ADD (attention deficit disorder)   . Fever blister 02/28/2016  . Hyperlipidemia   . Rosacea 04/19/2017    Past Surgical History:  Procedure Laterality Date  . COLONOSCOPY    . COLONOSCOPY WITH PROPOFOL N/A 09/22/2017   Procedure: COLONOSCOPY WITH PROPOFOL;  Surgeon: Robert Bellow, MD;  Location: [facility] ENDOSCOPY;  Service: Endoscopy;  Laterality: N/A;  . OPEN REDUCTION INTERNAL FIXATION (ORIF) HAND Left   . WISDOM TOOTH EXTRACTION  1981    There were no vitals filed for this visit.  Subjective Assessment - 12/15/18 1005    Subjective  Pt reported he is feeling hectic right now. When he sneezes now, low doesnt hurt. Pt plants his feet on the ground. Pt like using the urinal instead of the stall. Pt is able to deal with sounds of other people around if he sat down on the toilet in a stall compared to standing at the urinal Pt feels more comfortable and a better outlook on it.    Patient Stated Goals  be able to urinate without fear. be able to be confident when he needs to urinate.         [facility] PT Assessment - 12/15/18 1212      Observation/Other Assessments   Observations  increased propioception of anterior pelvic tilt in closed kinetic chain exercises. Minor adducted R knee in one  exercise.        Posture/Postural Control   Posture Comments  less upper trap overuse, less shoulder IR, less slumped posture. self-correcting to upright sitting                    [facility] Adult PT Treatment/Exercise - 12/15/18 1104      Therapeutic Activites    Other Therapeutic Activities  discussed strategies to be abel to use public restrooms        Neuro Re-ed    Neuro Re-ed Details   excessive cues for lower kinetic chain and pelvic isolation.  ( see pt instructions)                     PT Long Term Goals - 12/08/18 1020      PT LONG TERM GOAL #1   Title  Pt will decrease his NIH-CPSI score from 42% to < 22% in order to restore pelvic function    Time  10    Period  Weeks    Status  On-going      PT LONG TERM GOAL #2   Title  Pt will demo equal alignment of iliac crest and less gait deviations with compliance of shoe lift in order to minimize B heel  pain and LBP    Time  4    Period  Weeks    Status  Achieved      PT LONG TERM GOAL #3   Title  Pt will demo decreased abdominal separation from 3 fingers to < 1 fingers in order to minimize overactivity of pelvic floor    Time  6    Period  Weeks    Status  On-going      PT LONG TERM GOAL #4   Title  Pt will demo no tenderness/ tensions at pelvic floor B across 2 sessions in order to relax pelvic floor for urination in public restrooms    Time  8    Period  Weeks    Status  Achieved      PT LONG TERM GOAL #5   Title  Pt will be compliant with relaxation practices to promote proper pelvic floor lengthening and diaphragmatic excursion in order to retrain bladder    Time  10    Period  Weeks    Status  On-going      PT LONG TERM GOAL #6   Title  Pt will be IND with scoliosis specific HEP in order to minimize LBP    Time  10    Period  Weeks    Status  New      PT LONG TERM GOAL #7   Title  Pt will demo no upper trap tightness, rounded shoulders no cues for scapular stabilization in order to  perform  downward facing dog in stand<>floor t/f for HEP on the floor    Time  2    Period  Weeks    Status  New      PT LONG TERM GOAL #8   Title  Pt will report being able to use public restrooms or at friends's homes 100% of the time when he is out in the community in order to promote urinary function    Time  4    Period  Weeks    Status  New            Plan - 12/15/18 1055    Clinical Impression Statement  Pt showed improved propioception of isolated pelvic movements in closed kinetic chain exercises after training Tx. Strategized with patient to retrain nervous system for ways to urinate in public restrooms while traveling.  Explained nervous system and retraining on creating more calm and safe environment when traveling. Provided pelvic and hip mobility exercises that can be done while traveling. Pt remains motivated.  Pt continues to benefit from skilled PT.     Personal Factors and Comorbidities  Comorbidity 2    Comorbidities  ADD, high cholesterol    Examination-Activity Limitations  Toileting    Stability/Clinical Decision Making  Evolving/Moderate complexity    Rehab Potential  Good    PT Frequency  1x / week    PT Duration  Other (comment)   10   PT Treatment/Interventions  Moist Heat;Therapeutic activities;Therapeutic exercise;Gait training;Neuromuscular re-education;Patient/family education;Manual techniques;Joint Manipulations;Energy conservation;Taping    Consulted and Agree with Plan of Care  Patient       Patient will benefit from skilled therapeutic intervention in order to improve the following deficits and impairments:  Abnormal gait, Decreased mobility, Decreased endurance, Decreased coordination, Decreased strength, Decreased safety awareness, Increased muscle spasms, Hypomobility, Impaired flexibility, Improper body mechanics  Visit Diagnosis: Leg length discrepancy  Other idiopathic scoliosis, thoracolumbar region  Sacrococcygeal disorders, not  elsewhere classified  Problem List Patient Active Problem List   Diagnosis Date Noted  . Trigger ring finger of left hand 04/20/2018  . Preventative health care 04/20/2018  . Rosacea 04/19/2017  . Overweight (BMI 25.0-29.9) 04/19/2017  . Fever blister 02/28/2016  . Hyperlipidemia 11/26/2015  . Medication monitoring encounter 11/26/2015  . ADD (attention deficit disorder) 02/25/2015  . Annual physical exam 09/24/2014  . Personal history of colonic polyps 09/07/2012    Jerl Mina ,PT, DPT, E-RYT  12/15/2018, 12:13 PM  [place] MAIN [facility] SERVICES [address] [place], [place], [address] Phone: [phone]   Fax:  [phone]  Name: Adam Ryan MRN: [id number] Date of Birth: 01-16-1961

## 2018-12-15 NOTE — Patient Instructions (Addendum)
Sensory Retraining for using public restrooms  When travelling,  Use the stalls and sit   _go through   [employer] ,. Zooming into the stall    Conclude I am in the toilet stall  _points of contact support my body ( feet on the ground, rock the sitting bones for pelvic tilts)   _breathing awareness   [place] cues of familiarity ( picture of the window and the lawn, taken from home)   _I am safe here to let go of toxins I no longer need and conjuring the sound of urine falling from urethra to toilet .   _________   Sensory inputs ideas for retraining nervous system to relax in new environments like public restrooms : Sound: Audio recording of the creek that you said is  your safe place   Visual picture of the window you look out from home   Tactile: points of contact  "feet touching the ground, sitting bones on the seat"                Same concept of taking oyur blanket with you when you travel to new places, familiarity helps with acclimation  ____   Loosening hips and gaining propioception of pelvis - 4 exercises   Cardinal directions ( side to side, posterior tilt to anterior tilt)  Keeping sight of knees behind toes, moving pelvis in small area to still see the toes   Pelvic circles tiny ( clockwise and counterclockwise)    [employer] wheel ( ski track stance)   Small leg circles behind you    _____ During your travels  -keep up with witl deep core evel 1, deep core level 2   - body scan   Daily

## 2018-12-21 ENCOUNTER — Telehealth: Payer: BC Managed Care – PPO | Admitting: Urology

## 2018-12-27 ENCOUNTER — Ambulatory Visit: Payer: BC Managed Care – PPO | Admitting: Physical Therapy

## 2018-12-27 ENCOUNTER — Other Ambulatory Visit: Payer: Self-pay

## 2018-12-27 DIAGNOSIS — M2170 Unequal limb length (acquired), unspecified site: Secondary | ICD-10-CM | POA: Diagnosis not present

## 2018-12-27 DIAGNOSIS — M533 Sacrococcygeal disorders, not elsewhere classified: Secondary | ICD-10-CM

## 2018-12-27 DIAGNOSIS — M4125 Other idiopathic scoliosis, thoracolumbar region: Secondary | ICD-10-CM

## 2018-12-28 ENCOUNTER — Telehealth (INDEPENDENT_AMBULATORY_CARE_PROVIDER_SITE_OTHER): Payer: BC Managed Care – PPO | Admitting: Urology

## 2018-12-28 ENCOUNTER — Telehealth: Payer: BC Managed Care – PPO | Admitting: Family

## 2018-12-28 ENCOUNTER — Other Ambulatory Visit: Payer: Self-pay

## 2018-12-28 ENCOUNTER — Telehealth: Payer: Self-pay | Admitting: Urology

## 2018-12-28 DIAGNOSIS — M6289 Other specified disorders of muscle: Secondary | ICD-10-CM

## 2018-12-28 DIAGNOSIS — R300 Dysuria: Secondary | ICD-10-CM

## 2018-12-28 NOTE — Telephone Encounter (Signed)
-----   Message from Billey Co, MD sent at 12/28/2018 10:14 AM EDT ----- Regarding: follow up Please set him up for follow up with [name] in 6 months with PVR, thanks  Nickolas Madrid, MD 12/28/2018
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
## 2018-12-28 NOTE — Progress Notes (Signed)
Based on what you shared with me, I feel your condition warrants further evaluation and I recommend that you be seen for a face to face office visit.  Male bladder infections are not very common.  We worry about prostate or kidney conditions.  The standard of care is to examine the abdomen and kidneys, and to do a urine and blood test to make sure that something more serious is not going on.  We recommend that you see a provider today.  If your doctor's office is closed [facility] has the following Urgent Cares:    NOTE: If you entered your credit card information for this eVisit, you will not be charged. You may see a "hold" on your card for the $35 but that hold will drop off and you will not have a charge processed.   If you are having a true medical emergency please call 911.     For an urgent face to face visit, [place] has four urgent care centers for your convenience:   . [facility]    [phone]                  Get Driving Directions  [id number] [place], [place] [address] . 10 am to 8 pm Monday-Friday . 12 pm to 8 pm Saturday-Sunday   . [facility]gent [facility] at [place]                  Get Driving Directions  [id number] [place], [place], [place] [address] . 8 am to 8 pm Monday-Friday . 9 am to 6 pm Saturday . 11 am to 6 pm Sunday   . [facility] Urgent Care at [place]                  Get Driving Directions   [address] [place], [place] [address] . 8 am to 8 pm Monday-Friday . 8 am to 4 pm Saturday-Sunday    . [facility] Urgent [facility] at [place]                    Get Driving Directions  [id number]  [address] [place], [place] [address]  . Monday-Friday, 12 PM to 6 PM    Your e-visit answers were reviewed by a board certified advanced clinical practitioner to complete your personal care plan.  Thank you for using e-Visits.
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
## 2018-12-28 NOTE — Telephone Encounter (Signed)
Done

## 2019-01-09 ENCOUNTER — Encounter: Payer: Self-pay | Admitting: Physical Therapy

## 2019-01-10 ENCOUNTER — Ambulatory Visit: Payer: BC Managed Care – PPO | Admitting: Physical Therapy

## 2019-01-13 HISTORY — PX: TRIGGER FINGER RELEASE: SHX641

## 2019-01-17 ENCOUNTER — Ambulatory Visit: Payer: BC Managed Care – PPO | Attending: Urology | Admitting: Physical Therapy

## 2019-01-17 ENCOUNTER — Other Ambulatory Visit: Payer: Self-pay

## 2019-01-17 DIAGNOSIS — M4125 Other idiopathic scoliosis, thoracolumbar region: Secondary | ICD-10-CM | POA: Diagnosis present

## 2019-01-17 DIAGNOSIS — R2689 Other abnormalities of gait and mobility: Secondary | ICD-10-CM | POA: Diagnosis present

## 2019-01-17 DIAGNOSIS — M533 Sacrococcygeal disorders, not elsewhere classified: Secondary | ICD-10-CM | POA: Insufficient documentation

## 2019-01-17 DIAGNOSIS — M2170 Unequal limb length (acquired), unspecified site: Secondary | ICD-10-CM | POA: Insufficient documentation

## 2019-01-17 NOTE — Therapy (Addendum)
[place] MAIN [facility] SERVICES [address] [place], [place], [address] Phone: [phone]   Fax:  [phone]  Physical Therapy Treatment  Patient Details  Name: Adam Ryan MRN: [id number] Date of Birth: 1960-06-08 Referring Provider (PT): Sninisky   Encounter Date: 01/17/2019  PT End of Session - 01/17/19 0919    Visit Number  9    Date for PT Re-Evaluation  02/21/19    PT Start Time  0903    PT Stop Time  1001    PT Time Calculation (min)  58 min    Activity Tolerance  Patient tolerated treatment well    Behavior During Therapy  [facility] for tasks assessed/performed       Past Medical History:  Diagnosis Date  . ADD (attention deficit disorder)   . Fever blister 02/28/2016  . Hyperlipidemia   . Rosacea 04/19/2017    Past Surgical History:  Procedure Laterality Date  . COLONOSCOPY    . COLONOSCOPY WITH PROPOFOL N/A 09/22/2017   Procedure: COLONOSCOPY WITH PROPOFOL;  Surgeon: Robert Bellow, MD;  Location: [facility] ENDOSCOPY;  Service: Endoscopy;  Laterality: N/A;  . OPEN REDUCTION INTERNAL FIXATION (ORIF) HAND Left   . WISDOM TOOTH EXTRACTION  1981    There were no vitals filed for this visit.  Subjective Assessment - 01/17/19 0906    Subjective  Pt reported he had trigger finger surgery on L.    Patient Stated Goals  be able to urinate without fear. be able to be confident when he needs to urinate.         [facility] PT Assessment - 01/17/19 0957      Observation/Other Assessments   Observations  shoulder abd, elbow flexion, L 50 deg forearm angle to floor preTx, 20 deg post Tx   Good carry over with stand<> floor, pelvic propioception exercise      Palpation   Palpation comment  increased tightness proximal QL, SA mm , pect minor L                    [facility] Adult PT Treatment/Exercise - 01/17/19 0958      Neuro Re-ed    Neuro Re-ed Details   cued for alignment / technique for stretch routine see pt  instructions       Exercises   Exercises  --   see pt instruction, flexibility routine for maintainence      Moist Heat Therapy   Number Minutes Moist Heat  5 Minutes    Moist Heat Location  --   thoracic/ neck      Manual Therapy   Manual therapy comments  STM?MWM QL, SA mm , pect minor L                   PT Long Term Goals - 12/27/18 1014      PT LONG TERM GOAL #1   Title  Pt will decrease his NIH-CPSI score from 42% to < 22% in order to restore pelvic function    Time  10    Period  Weeks    Status  On-going      PT LONG TERM GOAL #2   Title  Pt will demo equal alignment of iliac crest and less gait deviations with compliance of shoe lift in order to minimize B heel pain and LBP    Time  4    Period  Weeks    Status  Achieved  PT LONG TERM GOAL #3   Title  Pt will demo decreased abdominal separation from 3 fingers to < 1 fingers in order to minimize overactivity of pelvic floor    Time  6    Period  Weeks    Status  Achieved      PT LONG TERM GOAL #4   Title  Pt will demo no tenderness/ tensions at pelvic floor B across 2 sessions in order to relax pelvic floor for urination in public restrooms    Time  8    Period  Weeks    Status  Achieved      PT LONG TERM GOAL #5   Title  Pt will be compliant with relaxation practices to promote proper pelvic floor lengthening and diaphragmatic excursion in order to retrain bladder    Time  10    Period  Weeks    Status  Achieved      Additional Long Term Goals   Additional Long Term Goals  Yes      PT LONG TERM GOAL #6   Title  Pt will be IND with back/pelvic flexibility HEP in order to minimize LBP    Time  10    Period  Weeks    Status  Revised      PT LONG TERM GOAL #7   Title  Pt will demo no upper trap tightness, rounded shoulders no cues for scapular stabilization in order to perform  downward facing dog in stand<>floor t/f for HEP on the floor    Time  2    Period  Weeks    Status  Achieved       PT LONG TERM GOAL #8   Title  Pt will report being able to use at friends's homes 100% of the time when he is out in the community in order to promote urinary function.    Time  4    Period  Weeks    Status  Achieved      PT LONG TERM GOAL  #9   TITLE  Pt will report being able to use public restrooms either standing /sitting with 75% level of confidence in order to be in the community    Baseline  level of confidence 50%    Time  6    Period  Weeks    Status  New    Target Date  02/07/19            Plan - 01/17/19 1002    Clinical Impression Statement  Pt showed good carry over with posture, less rounded shoulders, more spinal flexibility, and increased pelvic propioception. Consolidated flexibility routine today which pt demo'd IND.  Modified stretches in this routine to accommodate pt being post-op from trigger finger surgery as pt's hand was bandaged with ACE wrap. Positions were changed to decrease hand contact on the floor and replaced with prone on forearm or fingertip contact. Plan to d/c at next session.    Personal Factors and Comorbidities  Comorbidity 2    Comorbidities  ADD, high cholesterol    Examination-Activity Limitations  Toileting    Stability/Clinical Decision Making  Evolving/Moderate complexity    Rehab Potential  Good    PT Frequency  1x / week    PT Duration  8 weeks    PT Treatment/Interventions  Moist Heat;Therapeutic activities;Therapeutic exercise;Gait training;Neuromuscular re-education;Patient/family education;Manual techniques;Joint Manipulations;Energy conservation;Taping    Consulted and Agree with Plan of Care  Patient  Patient will benefit from skilled therapeutic intervention in order to improve the following deficits and impairments:  Abnormal gait, Decreased mobility, Decreased endurance, Decreased coordination, Decreased strength, Decreased safety awareness, Increased muscle spasms, Hypomobility, Impaired flexibility, Improper body  mechanics  Visit Diagnosis: Leg length discrepancy  Other idiopathic scoliosis, thoracolumbar region  Sacrococcygeal disorders, not elsewhere classified     Problem List Patient Active Problem List   Diagnosis Date Noted  . Trigger ring finger of left hand 04/20/2018  . Preventative health care 04/20/2018  . Rosacea 04/19/2017  . Overweight (BMI 25.0-29.9) 04/19/2017  . Fever blister 02/28/2016  . Hyperlipidemia 11/26/2015  . Medication monitoring encounter 11/26/2015  . ADD (attention deficit disorder) 02/25/2015  . Annual physical exam 09/24/2014  . Personal history of colonic polyps 09/07/2012    Jerl Mina ,PT, DPT, E-RYT  01/17/2019, 10:02 AM  [place] MAIN [facility] SERVICES [address] [place], [place], [address] Phone: [phone]   Fax:  [phone]  Name: JONATHANMICHAEL PASOS MRN: [id number] Date of Birth: September 06, 1960

## 2019-01-17 NOTE — Patient Instructions (Addendum)
minisquat > down dog > floor   childs pose ( rocking, toes tucked) on forearms while hand is healing  childs pose to the R and to the L   Sidelying:  1) quad stretch 2) butterfly  3) open book with pillows behind you back and between knees  On your back: 1) angel wings , dragging arms, shoulders down as you lower elbow down  5reps    2) happy baby 5 breaths     3) scoot hips to L, bend L knee, R hand pulling L thigh   Sidelying other side 1) quad stretch   2) butterfly    3) open book with pillows behind you back and between knees    Hands and knees- > downward facing dog > mini squat  > standing    [employer] squats, pressing head and shoulders, elbows back,  Exhale on the rise Knees behind toes  10 reps    Bear stretch in the doorway     L arm stretch by doorway

## 2019-01-24 ENCOUNTER — Ambulatory Visit: Payer: BC Managed Care – PPO | Admitting: Physical Therapy

## 2019-01-24 ENCOUNTER — Other Ambulatory Visit: Payer: Self-pay

## 2019-01-24 DIAGNOSIS — R2689 Other abnormalities of gait and mobility: Secondary | ICD-10-CM

## 2019-01-24 DIAGNOSIS — M2170 Unequal limb length (acquired), unspecified site: Secondary | ICD-10-CM | POA: Diagnosis not present

## 2019-01-24 NOTE — Patient Instructions (Addendum)
Exercise check off   ( Keep up the good work, Ottis! )  In addition to good sitting posture and standing posture   Mon Tue Wed Thurs Fri  Sat Sun  Stretch routine (see other sheet)            Deep core level 1 (breathing)            Deep core level 2 ( knee out)             Body scan             Locating a good pelvic health PT:  _[email or web address] Vanessa Kick, PT, DPT, MA, OCS, WCS, PRPC, LMP  ( THE BEST OF THE BEST!)   [email or web address] "Yoga for Pelvic pain" " Yoga for constipation"    _Toilet Meditation - English - PhysioYoga [email or web address].no    ___  Remember to use the video of the creek and the image of your back yard on your phone when travelling to have ease in public bathrooms   ___   Body scan  [email or web address].es  10 gifs for breathing https://www.doyou.com/10-awesome-gifs-for-calm-breathing-59450/  Insight meditation ( 60 min)  https://insighttimer.app.link/[email or web address]?_p=[email or web address]  Calm ( guided meditation at different durations of time)  http://norris-[email or web address]/  __   Stretch Routine  minisquat > down dog > floor  childs pose ( rocking, toes tucked) on forearms while hand is healing  childs pose to the R and to the L    Sidelying:  1) quad stretch 2) butterfly  3) open book with pillows behind you back and between knees   On your back: 1) angel wings , dragging arms, shoulders down as you lower elbow down  5reps 2) happy baby 5 breaths  3) scoot hips to L, bend L knee, R hand pulling L thigh    Sidelying other side 1) quad stretch 2) butterfly  3) open book with pillows behind you back and between knees    On your back: 1) angel wings , dragging arms, shoulders down as you lower elbow down  5reps 2) happy baby 5 breaths  3) scoot hips to R, bend R knee, L hand pulling R thigh    Hands and knees- > downward facing dog > mini squat  >  standing      Standing ones [employer] squats, pressing head and sho+ulders, elbows back,  Exhale on the rise Knees behind toes  10 reps      Bear stretch in the doorway      L arm on the wall to lengthen L side

## 2019-01-25 NOTE — Therapy (Signed)
[place] MAIN [facility] SERVICES [address] [place], [place], [address] Phone: [phone]   Fax:  [phone]  Physical Therapy Treatment / Discharge Summary   Patient Details  Name: Adam Ryan MRN: [id number] Date of Birth: [birth year] Referring Provider (PT): Sninisky   Encounter Date: 01/24/2019    Past Medical History:  Diagnosis Date  . ADD (attention deficit disorder)   . Fever blister 02/28/2016  . Hyperlipidemia   . Rosacea 04/19/2017    Past Surgical History:  Procedure Laterality Date  . COLONOSCOPY    . COLONOSCOPY WITH PROPOFOL N/A 09/22/2017   Procedure: COLONOSCOPY WITH PROPOFOL;  Surgeon: Robert Bellow, MD;  Location: [facility] ENDOSCOPY;  Service: Endoscopy;  Laterality: N/A;  . OPEN REDUCTION INTERNAL FIXATION (ORIF) HAND Left   . WISDOM TOOTH EXTRACTION  1981    There were no vitals filed for this visit.  Subjective Assessment - 01/25/19 2206    Subjective  Pt has been doing his stretches every other day.  Last week, pt visited a friend at their house and "it was successful" to urinate quickly. "I am getting more relaxed".    Patient Stated Goals  be able to urinate without fear. be able to be confident when he needs to urinate.         [facility] PT Assessment - 01/25/19 2207      Observation/Other Assessments   Observations  pelvic propioception without cuing                    [facility] Adult PT Treatment/Exercise - 01/25/19 2203      Therapeutic Activites    Other Therapeutic Activities  reassessed goals, discussed continuation of HEP post d/c, provided resources for Pelvic PT      Neuro Re-ed    Neuro Re-ed Details   cued for HEP                   PT Long Term Goals - 11/09//20 1014      PT LONG TERM GOAL #1   Title  Pt will decrease his NIH-CPSI score from 42% to < 22% in order to restore pelvic function  ( 01/23/19 : 17% )   Time  10    Period  Weeks    Status  Achieved       PT LONG TERM GOAL #2   Title  Pt will demo equal alignment of iliac crest and less gait deviations with compliance of shoe lift in order to minimize B heel pain and LBP    Time  4    Period  Weeks    Status  Achieved      PT LONG TERM GOAL #3   Title  Pt will demo decreased abdominal separation from 3 fingers to < 1 fingers in order to minimize overactivity of pelvic floor    Time  6    Period  Weeks    Status  Achieved      PT LONG TERM GOAL #4   Title  Pt will demo no tenderness/ tensions at pelvic floor B across 2 sessions in order to relax pelvic floor for urination in public restrooms    Time  8    Period  Weeks    Status  Achieved      PT LONG TERM GOAL #5   Title  Pt will be compliant with relaxation practices to promote proper pelvic floor lengthening and diaphragmatic excursion in order to  retrain bladder    Time  10    Period  Weeks    Status  Achieved      Additional Long Term Goals   Additional Long Term Goals  Yes      PT LONG TERM GOAL #6   Title  Pt will be IND with back/pelvic flexibility HEP in order to minimize LBP    Time  10    Period  Weeks    Status Achieved     PT LONG TERM GOAL #7   Title  Pt will demo no upper trap tightness, rounded shoulders no cues for scapular stabilization in order to perform  downward facing dog in stand<>floor t/f for HEP on the floor    Time  2    Period  Weeks    Status  Achieved      PT LONG TERM GOAL #8   Title  Pt will report being able to use bathroom  at friends's homes 100% of the time when he is out in the community in order to promote urinary function.    Time  4    Period  Weeks    Status  Achieved      PT LONG TERM GOAL  #9   TITLE  Pt will report being able to use public restrooms either standing /sitting with 75% level of confidence in order to be in the community    Baseline  level of confidence 50%    Time  6    Period  Weeks    Status  Achieved   Target Date  02/07/19            Plan -  01/25/19 2209    Clinical Impression Statement Pt has 100% of this goals with the  following improvements: _  able to use public restrooms either standing /sitting with 75% level of confidence _  Able to use at friends' bathrooms at their homes 100% of the time _ NIH-CPSI score decreased from 42%  to 17% , indicating significantly improved urinary function _no more  tenderness/ tensions at pelvic floor B, resolved diastasis recti, corrected pelvic malalignment and leg length difference with shoe lift, no more mm imbalance 2/2 slight scoliotic curve, and improved upright posture, less slumped/ posterior tilt of pelvis. These improvements indicate a better intraabdominal pressure system for spinal support and proper relaxation of pelvic floor  _ no more heel pain and LBP _IND with flexibility routine and relaxation/ mindfulness methods   Pt reports his Sx have improved "Moderately Better" based on the GROC scale.   Pt is ready for d/c at this time and has been provided Pelvic PT in [place] where he is moving in a month. Pt was also provided more resources for yoga, mindfulness.  Pt voiced he plans to continue with his practices and will seek a healthcare team in [place].      Personal Factors and Comorbidities  Comorbidity 2 .   Comorbidities  ADD, high cholesterol    Examination-Activity Limitations  Toileting    Stability/Clinical Decision Making  Evolving/Moderate complexity    Rehab Potential  Good    PT Frequency  1x / week    PT Duration  8 weeks    PT Treatment/Interventions  Moist Heat;Therapeutic activities;Therapeutic exercise;Gait training;Neuromuscular re-education;Patient/family education;Manual techniques;Joint Manipulations;Energy conservation;Taping    Consulted and Agree with Plan of Care  Patient       Patient will benefit from skilled therapeutic intervention in order to improve the following  deficits and impairments:  Abnormal gait, Decreased mobility, Decreased endurance,  Decreased coordination, Decreased strength, Decreased safety awareness, Increased muscle spasms, Hypomobility, Impaired flexibility, Improper body mechanics  Visit Diagnosis: Other abnormalities of gait and mobility     Problem List Patient Active Problem List   Diagnosis Date Noted  . Trigger ring finger of left hand 04/20/2018  . Preventative health care 04/20/2018  . Rosacea 04/19/2017  . Overweight (BMI 25.0-29.9) 04/19/2017  . Fever blister 02/28/2016  . Hyperlipidemia 11/26/2015  . Medication monitoring encounter 11/26/2015  . ADD (attention deficit disorder) 02/25/2015  . Annual physical exam 09/24/2014  . Personal history of colonic polyps 09/07/2012    Jerl Mina ,PT, DPT, E-RYT  01/25/2019, 10:09 PM  [place] MAIN [facility] SERVICES [address] [place], [place], [address] Phone: [phone]   Fax:  [phone]  Name: Adam Ryan MRN: [id number] Date of Birth: [birth year]

## 2019-04-18 ENCOUNTER — Other Ambulatory Visit: Payer: Self-pay | Admitting: Family Medicine

## 2019-04-18 DIAGNOSIS — E785 Hyperlipidemia, unspecified: Secondary | ICD-10-CM

## 2019-04-24 ENCOUNTER — Other Ambulatory Visit: Payer: Self-pay

## 2019-04-24 ENCOUNTER — Encounter: Payer: BC Managed Care – PPO | Admitting: Family Medicine

## 2019-04-24 ENCOUNTER — Ambulatory Visit (INDEPENDENT_AMBULATORY_CARE_PROVIDER_SITE_OTHER): Payer: BC Managed Care – PPO | Admitting: Family Medicine

## 2019-04-24 ENCOUNTER — Encounter: Payer: Self-pay | Admitting: Family Medicine

## 2019-04-24 VITALS — BP 120/72 | HR 85 | Temp 97.1°F | Resp 14 | Ht 64.0 in | Wt 152.0 lb

## 2019-04-24 DIAGNOSIS — Z1329 Encounter for screening for other suspected endocrine disorder: Secondary | ICD-10-CM

## 2019-04-24 DIAGNOSIS — L209 Atopic dermatitis, unspecified: Secondary | ICD-10-CM

## 2019-04-24 DIAGNOSIS — F320 Major depressive disorder, single episode, mild: Secondary | ICD-10-CM

## 2019-04-24 DIAGNOSIS — R739 Hyperglycemia, unspecified: Secondary | ICD-10-CM

## 2019-04-24 DIAGNOSIS — Z5181 Encounter for therapeutic drug level monitoring: Secondary | ICD-10-CM | POA: Diagnosis not present

## 2019-04-24 DIAGNOSIS — R399 Unspecified symptoms and signs involving the genitourinary system: Secondary | ICD-10-CM

## 2019-04-24 DIAGNOSIS — E785 Hyperlipidemia, unspecified: Secondary | ICD-10-CM | POA: Diagnosis not present

## 2019-04-24 DIAGNOSIS — Z130 Encounter for screening for diseases of the blood and blood-forming organs and certain disorders involving the immune mechanism: Secondary | ICD-10-CM

## 2019-04-24 DIAGNOSIS — Z0000 Encounter for general adult medical examination without abnormal findings: Secondary | ICD-10-CM | POA: Diagnosis not present

## 2019-04-24 DIAGNOSIS — H60501 Unspecified acute noninfective otitis externa, right ear: Secondary | ICD-10-CM

## 2019-04-24 DIAGNOSIS — Z13228 Encounter for screening for other metabolic disorders: Secondary | ICD-10-CM

## 2019-04-24 DIAGNOSIS — F902 Attention-deficit hyperactivity disorder, combined type: Secondary | ICD-10-CM | POA: Diagnosis not present

## 2019-04-24 MED ORDER — ATORVASTATIN CALCIUM 20 MG PO TABS: ORAL_TABLET | ORAL | 3 refills | Status: DC

## 2019-04-24 MED ORDER — PIMECROLIMUS 1 % EX CREA: TOPICAL_CREAM | CUTANEOUS | 0 refills | Status: DC

## 2019-04-24 MED ORDER — NEOMYCIN-POLYMYXIN-HC 3.5-10000-1 OT SOLN: 4.0000 [drp] | Freq: Four times a day (QID) | OTIC | 0 refills | Status: AC

## 2019-04-24 NOTE — Patient Instructions (Signed)
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
## 2019-04-25 LAB — CBC WITH DIFFERENTIAL/PLATELET
Absolute Monocytes: 567 cells/uL (ref 200–950)
Basophils Absolute: 42 cells/uL (ref 0–200)
Basophils Relative: 0.6 %
Eosinophils Absolute: 77 cells/uL (ref 15–500)
Eosinophils Relative: 1.1 %
HCT: 43.6 % (ref 38.5–50.0)
Hemoglobin: 14.9 g/dL (ref 13.2–17.1)
Lymphs Abs: 1323 cells/uL (ref 850–3900)
MCH: 31.6 pg (ref 27.0–33.0)
MCHC: 34.2 g/dL (ref 32.0–36.0)
MCV: 92.4 fL (ref 80.0–100.0)
MPV: 10.2 fL (ref 7.5–12.5)
Monocytes Relative: 8.1 %
Neutro Abs: 4991 cells/uL (ref 1500–7800)
Neutrophils Relative %: 71.3 %
Platelets: 319 10*3/uL (ref 140–400)
RBC: 4.72 10*6/uL (ref 4.20–5.80)
RDW: 12.3 % (ref 11.0–15.0)
Total Lymphocyte: 18.9 %
WBC: 7 10*3/uL (ref 3.8–10.8)

## 2019-04-25 LAB — HEMOGLOBIN A1C
Hgb A1c MFr Bld: 5.6 % of total Hgb (ref <5.7)
Mean Plasma Glucose: 114 (calc)
eAG (mmol/L): 6.3 (calc)

## 2019-04-25 LAB — COMPLETE METABOLIC PANEL WITH GFR
AG Ratio: 1.9 (calc) (ref 1.0–2.5)
ALT: 25 U/L (ref 9–46)
AST: 29 U/L (ref 10–35)
Albumin: 4.5 g/dL (ref 3.6–5.1)
Alkaline phosphatase (APISO): 72 U/L (ref 35–144)
BUN: 20 mg/dL (ref 7–25)
CO2: 32 mmol/L (ref 20–32)
Calcium: 9.7 mg/dL (ref 8.6–10.3)
Chloride: 99 mmol/L (ref 98–110)
Creat: 0.93 mg/dL (ref 0.70–1.33)
GFR, Est African American: 105 mL/min/{1.73_m2} (ref >=60)
GFR, Est Non African American: 91 mL/min/{1.73_m2} (ref >=60)
Globulin: 2.4 g/dL (calc) (ref 1.9–3.7)
Glucose, Bld: 85 mg/dL (ref 65–99)
Potassium: 4.7 mmol/L (ref 3.5–5.3)
Sodium: 138 mmol/L (ref 135–146)
Total Bilirubin: 0.5 mg/dL (ref 0.2–1.2)
Total Protein: 6.9 g/dL (ref 6.1–8.1)

## 2019-04-25 LAB — LIPID PANEL
Cholesterol: 190 mg/dL (ref <200)
HDL: 82 mg/dL (ref >=40)
LDL Cholesterol (Calc): 88 mg/dL (calc)
Non-HDL Cholesterol (Calc): 108 mg/dL (calc) (ref <130)
Total CHOL/HDL Ratio: 2.3 (calc) (ref <5.0)
Triglycerides: 105 mg/dL (ref <150)

## 2019-04-25 LAB — TSH: TSH: 1.11 mIU/L (ref 0.40–4.50)

## 2019-05-12 ENCOUNTER — Other Ambulatory Visit: Payer: Self-pay | Admitting: Family Medicine

## 2019-05-12 NOTE — Telephone Encounter (Signed)
Requested medication (s) are due for refill today -yes  Requested medication (s) are on the active medication list -yes  Future visit scheduled -yes  Last refill: 04/21/19  Notes to clinic: Patient request Rx that passes protocol- Rx written by PCP no longer at practice  Requested Prescriptions  Pending Prescriptions Disp Refills   valACYclovir (VALTREX) 1000 MG tablet [Pharmacy Med Name: VALACYCLOVIR HCL 1 GRAM TABLET] 8 tablet 6    Sig: TAKE 2 TABLETS BY MOUTH AT ONSET OF FEVER BLISTER, THEN TWO MORE TABS 12HRS LATER PER OUTBREAK      Antimicrobials:  Antiviral Agents - Anti-Herpetic Passed - 05/12/2019  4:20 PM      Passed - Valid encounter within last 12 months    Recent Outpatient Visits           2 weeks ago Adult general medical exam   [facility] Delsa Grana, PA-C   1 year ago Preventative health care   [place], Satira Anis, MD   2 years ago Annual physical exam   [place], Satira Anis, MD   3 years ago Hyperlipidemia, unspecified hyperlipidemia type   [place], Satira Anis, MD   3 years ago ADD (attention deficit disorder)   [place], Satira Anis, MD       Future Appointments             In 3 weeks Delsa Grana, PA-C [facility], [place]   In 1 month [place], Hunt Oris, PA-C [place]   In 11 months Delsa Grana, PA-C [facility]                Requested Prescriptions  Pending Prescriptions Disp Refills   valACYclovir (VALTREX) 1000 MG tablet [Pharmacy Med Name: VALACYCLOVIR HCL 1 GRAM TABLET] 8 tablet 6    Sig: TAKE 2 TABLETS BY MOUTH AT ONSET OF FEVER BLISTER, THEN TWO MORE TABS 12HRS LATER PER OUTBREAK      Antimicrobials:  Antiviral Agents - Anti-Herpetic Passed - 05/12/2019  4:20 PM      Passed - Valid encounter within last 12 months    Recent Outpatient Visits            2 weeks ago Adult general medical exam   [facility] Delsa Grana, PA-C   1 year ago Preventative health care   [place], Satira Anis, MD   2 years ago Annual physical exam   [place], Satira Anis, MD   3 years ago Hyperlipidemia, unspecified hyperlipidemia type   [place], Satira Anis, MD   3 years ago ADD (attention deficit disorder)   [place], Satira Anis, MD       Future Appointments             In 3 weeks Delsa Grana, PA-C [facility], [place]   In 1 month McG[place]an, Hunt Oris, [place]   In 11 months Delsa Grana, PA-C [facility], [facility]

## 2019-06-08 ENCOUNTER — Encounter: Payer: Self-pay | Admitting: Family Medicine

## 2019-06-08 ENCOUNTER — Other Ambulatory Visit: Payer: Self-pay

## 2019-06-08 ENCOUNTER — Ambulatory Visit: Payer: BC Managed Care – PPO | Admitting: Family Medicine

## 2019-06-08 VITALS — BP 126/84 | HR 87 | Temp 98.3°F | Resp 14 | Ht 64.0 in | Wt 154.4 lb

## 2019-06-08 DIAGNOSIS — L309 Dermatitis, unspecified: Secondary | ICD-10-CM

## 2019-06-08 DIAGNOSIS — L719 Rosacea, unspecified: Secondary | ICD-10-CM

## 2019-06-08 DIAGNOSIS — Z760 Encounter for issue of repeat prescription: Secondary | ICD-10-CM

## 2019-06-08 MED ORDER — KETOCONAZOLE 2 % EX CREA: 1.0000 "application " | TOPICAL_CREAM | Freq: Two times a day (BID) | CUTANEOUS | 3 refills | Status: DC

## 2019-06-08 MED ORDER — EUCRISA 2 % EX OINT: 1.0000 "application " | TOPICAL_OINTMENT | Freq: Two times a day (BID) | CUTANEOUS | 3 refills | Status: DC | PRN

## 2019-06-08 MED ORDER — MINOCYCLINE HCL 50 MG PO CAPS: 50.0000 mg | ORAL_CAPSULE | Freq: Every day | ORAL | 3 refills | Status: DC

## 2019-06-08 NOTE — Progress Notes (Signed)
Name: Adam Ryan   MRN: [id number]    DOB: [birth year]   Date:06/08/2019       Progress Note  Chief Complaint  Patient presents with  . Follow-up  . Medication Refill  . Oral Swelling    dryness/redness  . Eczema     Subjective:   Adam Ryan is a 59 y.o. male, presents to clinic for routine follow up on the conditions listed above.  Patient presents for refills on multiple topical medications for various dermatological conditions.  He states that he is going to be moving soon to the [place] to the state of [place], he has history of rosacea, fever blisters and possibly eczema.   He uses minocycline for rosacea when it flares up.  He does break out occasionally around his mouth with cracks of the side of his of his lips which sometimes turn into cold sores.  He has used topical ketoconazole cream and Eucrisa for breakouts to his face.  He also requests pimecrolimus 1% cream for rash to face which she uses for his rosacea and eczema Does use Valtrex for cold sores as well.  There are multiple other topical creams and ointments on his chart which we reviewed today several which we have discontinued because he is not taking.  He does have atorvastatin 20 mg which was just refilled last month with 90-day supply and multiple refills.  His medicines for anxiety insomnia and ADHD are managed through specialist.  Otherwise sees the urologist and is on Myrbetriq and Flomax so he will follow up with them for refills.  He plans on moving in about 2 months  Patient Active Problem List   Diagnosis Date Noted  . Rosacea 04/19/2017  . Overweight (BMI 25.0-29.9) 04/19/2017  . Hyperlipidemia 11/26/2015  . ADD (attention deficit disorder) 02/25/2015  . Personal history of colonic polyps 09/07/2012    Past Surgical History:  Procedure Laterality Date  . COLONOSCOPY    . COLONOSCOPY WITH PROPOFOL N/A 09/22/2017   Procedure: COLONOSCOPY WITH PROPOFOL;  Surgeon: Robert Bellow, MD;   Location: [facility] ENDOSCOPY;  Service: Endoscopy;  Laterality: N/A;  . OPEN REDUCTION INTERNAL FIXATION (ORIF) HAND Left   . TRIGGER FINGER RELEASE  01/13/2019   left ring finger  . WISDOM TOOTH EXTRACTION  1981    Family History  Problem Relation Age of Onset  . Parkinson's disease Mother   . Heart disease Mother        open heart surgery, staph infection, died 3 months after surg  . Cancer Father        bladder  . Asthma Father   . Diabetes Father   . Hypertension Father   . Heart disease Paternal Grandfather     Social History   Tobacco Use  . Smoking status: Former Smoker    Types: Cigars  . Smokeless tobacco: Never Used  Substance Use Topics  . Alcohol use: Yes    Alcohol/week: 3.0 standard drinks    Types: 3 Cans of beer per week  . Drug use: No      Current Outpatient Medications:  .  atorvastatin (LIPITOR) 20 MG tablet, TAKE 1 TABLET BY MOUTH EVERYDAY AT BEDTIME, Disp: 90 tablet, Rfl: 3 .  betamethasone dipropionate (DIPROLENE) 0.05 % cream, , Disp: , Rfl:  .  Ciclopirox 1 % shampoo, , Disp: , Rfl:  .  escitalopram (LEXAPRO) 10 MG tablet, Take 10 mg by mouth daily., Disp: , Rfl:  .  EUCRISA 2 %  OINT, Apply 1 application topically 2 (two) times daily as needed (can use on face for eczema)., Disp: 60 g, Rfl: 3 .  Melatonin 3-10 MG TABS, Take 2 mg by mouth daily., Disp: , Rfl:  .  minocycline (MINOCIN) 50 MG capsule, Take 1 capsule (50 mg total) by mouth daily., Disp: 90 capsule, Rfl: 3 .  mirabegron ER (MYRBETRIQ) 50 MG TB24 tablet, Take 1 tablet (50 mg total) by mouth daily., Disp: 90 tablet, Rfl: 3 .  Multiple Vitamin (MULTIVITAMIN) capsule, Take 1 capsule by mouth daily., Disp: , Rfl:  .  Omega-3 Fatty Acids (FISH OIL PO), Take by mouth., Disp: , Rfl:  .  pimecrolimus (ELIDEL) 1 % cream, APPLY 1 APPLICATION APPLY ON THE SKIN TWICE A DAY FOR RASH ON FACE, Disp: 30 g, Rfl: 0 .  tamsulosin (FLOMAX) 0.4 MG CAPS capsule, Take 1 capsule (0.4 mg total) by mouth daily.,  Disp: 30 capsule, Rfl: 11 .  traZODone (DESYREL) 50 MG tablet, Take 25-50 mg by mouth at bedtime., Disp: , Rfl:  .  valACYclovir (VALTREX) 1000 MG tablet, TAKE 2 TABLETS BY MOUTH AT ONSET OF FEVER BLISTER, THEN TWO MORE TABS 12HRS LATER PER OUTBREAK, Disp: 8 tablet, Rfl: 6 .  VYVANSE 60 MG capsule, Take 60 mg by mouth daily. , Disp: , Rfl: 0 .  ketoconazole (NIZORAL) 2 % cream, Apply 1 application topically 2 (two) times daily., Disp: 30 g, Rfl: 3  No Known Allergies  Chart Review Today: I personally reviewed active problem list, medication list, allergies, family history, social history, health maintenance, notes from last encounter, lab results, imaging with the patient/caregiver today.   Review of Systems  10 Systems reviewed and are negative for acute change except as noted in the HPI.  Objective:    Vitals:   06/08/19 1148  BP: 126/84  Pulse: 87  Resp: 14  Temp: 98.3 F (36.8 C)  SpO2: 99%  Weight: 154 lb 6.4 oz (70 kg)  Height: 5\' 4"  (1.626 m)    Body mass index is 26.5 kg/m.  Physical Exam Vitals and nursing note reviewed.  Constitutional:      General: He is not in acute distress.    Appearance: Normal appearance. He is well-developed. He is not ill-appearing, toxic-appearing or diaphoretic.  HENT:     Head: Normocephalic and atraumatic.     Nose: Nose normal.  Eyes:     General:        Right eye: No discharge.        Left eye: No discharge.     Conjunctiva/sclera: Conjunctivae normal.  Neck:     Trachea: No tracheal deviation.  Cardiovascular:     Rate and Rhythm: Normal rate and regular rhythm.  Pulmonary:     Effort: Pulmonary effort is normal. No respiratory distress.     Breath sounds: No stridor.  Musculoskeletal:        General: Normal range of motion.  Skin:    General: Skin is warm and dry.     Findings: No rash.  Neurological:     Mental Status: He is alert.     Motor: No abnormal muscle tone.     Coordination: Coordination normal.    Psychiatric:        Mood and Affect: Mood normal.        Behavior: Behavior normal.     PHQ2/9: Depression screen [facility] 2/9 06/08/2019 04/24/2019 04/20/2018 04/19/2017 03/04/2016  Decreased Interest 0 0 0 0 0  Down, Depressed,  Hopeless 0 0 0 1 0  PHQ - 2 Score 0 0 0 1 0  Altered sleeping 1 2 0 - -  Tired, decreased energy 1 0 0 - -  Change in appetite 0 0 0 - -  Feeling bad or failure about yourself  0 0 0 - -  Trouble concentrating 0 3 0 - -  Moving slowly or fidgety/restless 0 0 0 - -  Suicidal thoughts 0 - 0 - -  PHQ-9 Score 2 5 0 - -  Difficult doing work/chores Not difficult at all Not difficult at all Not difficult at all - -    phq 9 is neg, reviewed  Fall Risk: Fall Risk  06/08/2019 04/24/2019 04/20/2018 04/19/2017 03/04/2016  Falls in the past year? 0 0 0 No No  Number falls in past yr: 0 0 - - -  Injury with Fall? 0 0 - - -      Assessment & Plan:     ICD-10-CM   1. Rosacea  L71.9 ketoconazole (NIZORAL) 2 % cream    EUCRISA 2 % OINT   current meds refilled for pt  2. Encounter for medication refill  Z76.0 minocycline (MINOCIN) 50 MG capsule    ketoconazole (NIZORAL) 2 % cream    EUCRISA 2 % OINT  3. Eczema, unspecified type  L30.9 ketoconazole (NIZORAL) 2 % cream    EUCRISA 2 % OINT     No follow-ups on file.   Delsa Grana, PA-C 06/08/19 12:14 PM

## 2019-06-12 ENCOUNTER — Encounter: Payer: Self-pay | Admitting: Family Medicine

## 2019-06-23 ENCOUNTER — Ambulatory Visit: Payer: BC Managed Care – PPO | Attending: Internal Medicine

## 2019-06-23 DIAGNOSIS — Z23 Encounter for immunization: Secondary | ICD-10-CM

## 2019-06-23 NOTE — Progress Notes (Signed)
   Covid-19 Vaccination Clinic  Name:  Adam Ryan    MRN: [id number] DOB: 03/03/1961  06/23/2019  [name] was observed post Covid-19 immunization for 15 minutes without incident. He was provided with Vaccine Information Sheet and instruction to access the V-Safe system.   [name] was instructed to call 911 with any severe reactions post vaccine: Marland Kitchen Difficulty breathing  . Swelling of face and throat  . A fast heartbeat  . A bad rash all over body  . Dizziness and weakness   Immunizations Administered    Name Date Dose VIS Date Route   Pfizer COVID-19 Vaccine 06/23/2019  8:22 AM 0.3 mL 02/24/2019 Intramuscular   Manufacturer: [place]   Lot: [id number]   [place]: [id number]

## 2019-06-28 ENCOUNTER — Other Ambulatory Visit: Payer: Self-pay

## 2019-06-28 ENCOUNTER — Encounter: Payer: Self-pay | Admitting: Urology

## 2019-06-28 ENCOUNTER — Ambulatory Visit: Payer: BC Managed Care – PPO | Admitting: Urology

## 2019-06-28 ENCOUNTER — Other Ambulatory Visit: Payer: Self-pay | Admitting: Family Medicine

## 2019-06-28 VITALS — BP 144/73 | HR 73 | Ht 64.0 in | Wt 148.8 lb

## 2019-06-28 DIAGNOSIS — N401 Enlarged prostate with lower urinary tract symptoms: Secondary | ICD-10-CM

## 2019-06-28 DIAGNOSIS — N138 Other obstructive and reflux uropathy: Secondary | ICD-10-CM

## 2019-06-28 DIAGNOSIS — M6289 Other specified disorders of muscle: Secondary | ICD-10-CM

## 2019-06-28 LAB — BLADDER SCAN AMB NON-IMAGING: Scan Result: 45

## 2019-06-28 MED ORDER — TAMSULOSIN HCL 0.4 MG PO CAPS: 0.4000 mg | ORAL_CAPSULE | Freq: Every day | ORAL | 3 refills | Status: DC

## 2019-06-28 MED ORDER — MIRABEGRON ER 50 MG PO TB24: 50.0000 mg | ORAL_TABLET | Freq: Every day | ORAL | 3 refills | Status: DC

## 2019-06-28 NOTE — Progress Notes (Signed)
06/28/2019 9:32 AM   Adam Ryan 05-09-1960 [id number]  Referring provider: [facility], [address] [place],  [place] [id number]  Chief Complaint  Patient presents with  . Benign Prostatic Hypertrophy    HPI: Adam Ryan is a 59 year old male with BPH with LU TS and pelvic floor dysfunction who presents today for a 6 month follow up.  BPH WITH LUTS  (prostate and/or bladder) IPSS score: 17/4     PVR: 45 mL  Previous score: 15/4   Previous PVR: 62 mL  Major complaint(s): Incontinence, difficulty urinating and a weak urinary stream for several years. Denies any dysuria, hematuria or suprapubic pain.   Currently taking: Myrbetriq 50 mg daily and tamsulosin 0.4 mg daily.  He is also completed pelvic floor physical therapy and states that that was very beneficial for him.  His has had cystoscopy with Dr. Diamantina Providence on 10/26/2018 was NED. Marland Kitchen   Denies any recent fevers, chills, nausea or vomiting.  He does not have a family history of PCa.  IPSS    Row Name 06/28/19 0800         International Prostate Symptom Score   How often have you had the sensation of not emptying your bladder?  More than half the time     How often have you had to urinate less than every two hours?  About half the time     How often have you found you stopped and started again several times when you urinated?  Almost always     How often have you found it difficult to postpone urination?  Not at All     How often have you had a weak urinary stream?  More than half the time     How often have you had to strain to start urination?  Not at All     How many times did you typically get up at night to urinate?  1 Time     Total IPSS Score  17       Quality of Life due to urinary symptoms   If you were to spend the rest of your life with your urinary condition just the way it is now how would you feel about that?  Mostly Disatisfied        Score:  1-7 Mild 8-19  Moderate 20-35 Severe     PMH: Past Medical History:  Diagnosis Date  . ADD (attention deficit disorder)   . Fever blister 02/28/2016  . Hyperlipidemia   . Rosacea 04/19/2017  . Trigger ring finger of left hand 04/20/2018    Surgical History: Past Surgical History:  Procedure Laterality Date  . COLONOSCOPY    . COLONOSCOPY WITH PROPOFOL N/A 09/22/2017   Procedure: COLONOSCOPY WITH PROPOFOL;  Surgeon: Robert Bellow, MD;  Location: [facility] ENDOSCOPY;  Service: Endoscopy;  Laterality: N/A;  . OPEN REDUCTION INTERNAL FIXATION (ORIF) HAND Left   . TRIGGER FINGER RELEASE  01/13/2019   left ring finger  . WISDOM TOOTH EXTRACTION  1981    Home Medications:  Allergies as of 06/28/2019   No Known Allergies     Medication List       Accurate as of June 28, 2019  9:32 AM. If you have any questions, ask your nurse or doctor.        atorvastatin 20 MG tablet Commonly known as: LIPITOR TAKE 1 TABLET BY MOUTH EVERYDAY AT BEDTIME   escitalopram 10 MG tablet  Commonly known as: LEXAPRO Take 10 mg by mouth daily.   Eucrisa 2 % Oint Generic drug: Crisaborole Apply 1 application topically 2 (two) times daily as needed (can use on face for eczema).   FISH OIL PO Take by mouth.   ketoconazole 2 % cream Commonly known as: NIZORAL Apply 1 application topically 2 (two) times daily.   Melatonin 3-10 MG Tabs Take 2 mg by mouth daily.   minocycline 50 MG capsule Commonly known as: MINOCIN Take 1 capsule (50 mg total) by mouth daily.   mirabegron ER 50 MG Tb24 tablet Commonly known as: MYRBETRIQ Take 1 tablet (50 mg total) by mouth daily.   multivitamin capsule Take 1 capsule by mouth daily.   pimecrolimus 1 % cream Commonly known as: ELIDEL APPLY 1 APPLICATION APPLY ON THE SKIN TWICE A DAY FOR RASH ON FACE   tamsulosin 0.4 MG Caps capsule Commonly known as: FLOMAX Take 1 capsule (0.4 mg total) by mouth daily.   traZODone 50 MG tablet Commonly known as: DESYREL Take  25-50 mg by mouth at bedtime.   valACYclovir 1000 MG tablet Commonly known as: VALTREX TAKE 2 TABLETS BY MOUTH AT ONSET OF FEVER BLISTER, THEN TWO MORE TABS 12HRS LATER PER OUTBREAK   Vyvanse 60 MG capsule Generic drug: lisdexamfetamine Take 60 mg by mouth daily.       Allergies: No Known Allergies  Family History: Family History  Problem Relation Age of Onset  . Parkinson's disease Mother   . Heart disease Mother        open heart surgery, staph infection, died 3 months after surg  . Cancer Father        bladder  . Asthma Father   . Diabetes Father   . Hypertension Father   . Heart disease Paternal Grandfather     Social History:  reports that he has quit smoking. His smoking use included cigars. He has never used smokeless tobacco. He reports current alcohol use of about 3.0 standard drinks of alcohol per week. He reports that he does not use drugs.  ROS: Pertinent ROS in HPI  Physical Exam: BP (!) 144/73   Pulse 73   Ht 5\' 4"  (1.626 m)   Wt 148 lb 12.8 oz (67.5 kg)   BMI 25.54 kg/m   Constitutional:  Well nourished. Alert and oriented, No acute distress. HEENT: [place] AT, mask in place.  Trachea midline, no masses. Cardiovascular: No clubbing, cyanosis, or edema. Respiratory: Normal respiratory effort, no increased work of breathing. GI: Abdomen is soft, non tender, non distended, no abdominal masses.  GU: No CVA tenderness.  No bladder fullness or masses.  Patient with circumcised phallus.  Urethral meatus is patent.  No penile discharge. No penile lesions or rashes. Scrotum without lesions, cysts, rashes and/or edema.  Testicles are located scrotally bilaterally. No masses are appreciated in the testicles. Left and right epididymis are normal. Rectal: Patient with  normal sphincter tone. Anus and perineum without scarring or rashes. No rectal masses are appreciated. Prostate is approximately 40 grams, no nodules are appreciated. Seminal vesicles could not be palpated.   Skin: No rashes, bruises or suspicious lesions. Lymph: No inguinal adenopathy. Neurologic: Grossly intact, no focal deficits, moving all 4 extremities. Psychiatric: Normal mood and affect.  Laboratory Data: Lab Results  Component Value Date   WBC 7.0 04/24/2019   HGB 14.9 04/24/2019   HCT 43.6 04/24/2019   MCV 92.4 04/24/2019   PLT 319 04/24/2019    Lab Results  Component Value Date   CREATININE 0.93 04/24/2019    Lab Results  Component Value Date   PSA 1.2 04/20/2018   PSA 1.4 04/19/2017    Lab Results  Component Value Date   HGBA1C 5.6 04/24/2019    Lab Results  Component Value Date   TSH 1.11 04/24/2019       Component Value Date/Time   CHOL 190 04/24/2019 1457   CHOL 199 09/26/2014 1025   HDL 82 04/24/2019 1457   HDL 65 09/26/2014 1025   CHOLHDL 2.3 04/24/2019 1457   VLDL 19 06/20/2016 1023   LDLCALC 88 04/24/2019 1457    Lab Results  Component Value Date   AST 29 04/24/2019   Lab Results  Component Value Date   ALT 25 04/24/2019    Urinalysis    Component Value Date/Time   COLORURINE YELLOW 04/20/2018 1354   APPEARANCEUR Clear 10/26/2018 1306   LABSPEC 1.017 04/20/2018 1354   PHURINE 5.5 04/20/2018 1354   GLUCOSEU Negative 10/26/2018 1306   HGBUR NEGATIVE 04/20/2018 1354   BILIRUBINUR Negative 10/26/2018 [place] 04/20/2018 1354   PROTEINUR Negative 10/26/2018 1306   PROTEINUR NEGATIVE 04/20/2018 1354   NITRITE Negative 10/26/2018 1306   LEUKOCYTESUR Negative 10/26/2018 1306    I have reviewed the labs.   Pertinent Imaging: Results for SIPRIANO, CANCEL (MRN [id number]) as of 06/28/2019 09:20  Ref. Range 06/28/2019 08:59  Scan Result Unknown 45    Assessment & Plan:    1. Pelvic floor dysfunction - Bladder Scan (Post Void Residual) in office - Continue pelvic floor exercises at home -Continue the tamsulosin and Myrbetriq  2. BPH with obstruction/lower urinary tract symptoms - PSA pending - IPSS score is  17/4, it is stable Continue conservative management, avoiding bladder irritants and timed voiding's Most bothersome symptoms is/are "shy bladder" Continue tamsulosin 0.4 mg daily and Myrbetriq 50 mg daily RTC in 12 months for IPSS, PSA, PVR and exam - if PSA stable  Patient may be relocating to [place] state within the next year.  Return in about 1 year (around 06/27/2020) for IPSS, SHIM, PVR, PSA and exam.  These notes generated with voice recognition software. I apologize for typographical errors.  Zara Council, PA-C  [facility] [address]  [place], [place] [address] [phone]

## 2019-06-29 LAB — PSA: Prostate Specific Ag, Serum: 1.2 ng/mL (ref 0.0–4.0)

## 2019-07-18 ENCOUNTER — Ambulatory Visit: Payer: BC Managed Care – PPO | Attending: Internal Medicine

## 2019-07-18 DIAGNOSIS — Z23 Encounter for immunization: Secondary | ICD-10-CM

## 2019-07-18 NOTE — Progress Notes (Signed)
.     Covid-19 Vaccination Clinic  Name:  Adam Ryan    MRN: [id number] DOB: [birth year]  07/18/2019  [name] was observed post Covid-19 immunization for 15 minutes without incident. He was provided with Vaccine Information Sheet and instruction to access the V-Safe system.   [name] was instructed to call 911 with any severe reactions post vaccine: Marland Kitchen Difficulty breathing  . Swelling of face and throat  . A fast heartbeat  . A bad rash all over body  . Dizziness and weakness   Immunizations Administered    Name Date Dose VIS Date Route   Pfizer COVID-19 Vaccine 07/18/2019  9:59 AM 0.3 mL 05/10/2018 Intramuscular   Manufacturer: [place]   Lot: [id number]   [place]: [id number]

## 2019-07-31 ENCOUNTER — Other Ambulatory Visit: Payer: Self-pay | Admitting: Urology

## 2019-07-31 DIAGNOSIS — M6289 Other specified disorders of muscle: Secondary | ICD-10-CM

## 2019-08-11 ENCOUNTER — Ambulatory Visit: Payer: BC Managed Care – PPO | Attending: Urology | Admitting: Physical Therapy

## 2019-08-11 ENCOUNTER — Other Ambulatory Visit: Payer: Self-pay

## 2019-08-11 ENCOUNTER — Encounter: Payer: Self-pay | Admitting: Physical Therapy

## 2019-08-11 DIAGNOSIS — M6208 Separation of muscle (nontraumatic), other site: Secondary | ICD-10-CM | POA: Diagnosis present

## 2019-08-11 DIAGNOSIS — R278 Other lack of coordination: Secondary | ICD-10-CM | POA: Diagnosis present

## 2019-08-11 DIAGNOSIS — M4125 Other idiopathic scoliosis, thoracolumbar region: Secondary | ICD-10-CM | POA: Insufficient documentation

## 2019-08-11 DIAGNOSIS — M2170 Unequal limb length (acquired), unspecified site: Secondary | ICD-10-CM | POA: Insufficient documentation

## 2019-08-11 NOTE — Patient Instructions (Addendum)
Avoid straining pelvic floor, abdominal muscles , spine  Use log rolling technique instead of getting out of bed with your neck or the sit-up     Log rolling into and out of bed   Log rolling into and out of bed If getting out of bed on R side, Bent knees, scoot hips/ shoulder to L  Raise R arm completely overhead, rolling onto armpit  Then lower bent knees to bed to get into complete side lying position  Then drop legs off bed, and push up onto R elbow/forearm, and use L hand to push onto the bed    ___  Deep core level 1 and 2 ( 2 x day) see handout)  Remember less effort  Body scan ( audio file)  Once a day         Apply body scan and breathing practice when using the toilet in public restrooms to minimzie anxiety   ___  Apply the pelvic tilt and soft breathing at diaphragm not the chest in order to relax pelvic floor mm  Keep up your great practices !
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
## 2020-04-24 ENCOUNTER — Encounter: Payer: BC Managed Care – PPO | Admitting: Family Medicine

## 2020-05-02 ENCOUNTER — Encounter: Payer: BC Managed Care – PPO | Admitting: Family Medicine

## 2020-05-15 ENCOUNTER — Other Ambulatory Visit: Payer: Self-pay

## 2020-05-15 ENCOUNTER — Encounter: Payer: Self-pay | Admitting: Family Medicine

## 2020-05-15 DIAGNOSIS — E785 Hyperlipidemia, unspecified: Secondary | ICD-10-CM

## 2020-05-15 MED ORDER — ATORVASTATIN CALCIUM 20 MG PO TABS: ORAL_TABLET | ORAL | 0 refills | Status: DC

## 2020-05-22 ENCOUNTER — Other Ambulatory Visit: Payer: Self-pay | Admitting: Family Medicine

## 2020-05-22 DIAGNOSIS — E785 Hyperlipidemia, unspecified: Secondary | ICD-10-CM

## 2020-06-09 ENCOUNTER — Telehealth: Payer: Self-pay | Admitting: Family Medicine

## 2020-06-09 DIAGNOSIS — E785 Hyperlipidemia, unspecified: Secondary | ICD-10-CM

## 2020-06-10 NOTE — Telephone Encounter (Signed)
Pt needs appt

## 2020-06-10 NOTE — Telephone Encounter (Signed)
He is not our pt any longer, he transferred

## 2020-06-28 ENCOUNTER — Other Ambulatory Visit: Payer: Self-pay

## 2020-07-01 ENCOUNTER — Other Ambulatory Visit: Payer: Self-pay

## 2020-07-01 ENCOUNTER — Other Ambulatory Visit: Payer: BC Managed Care – PPO

## 2020-07-01 DIAGNOSIS — N138 Other obstructive and reflux uropathy: Secondary | ICD-10-CM

## 2020-07-01 DIAGNOSIS — N401 Enlarged prostate with lower urinary tract symptoms: Secondary | ICD-10-CM

## 2020-07-02 LAB — PSA: Prostate Specific Ag, Serum: 1.5 ng/mL (ref 0.0–4.0)

## 2020-07-02 NOTE — Progress Notes (Signed)
07/03/2020 9:14 AM   Adam Ryan 01-13-1961 [id number]  Referring provider: Delsa Grana, PA-C [address] [place],  [place] [address]  Chief Complaint  Patient presents with  . Benign Prostatic Hypertrophy   Urological history: 1. BPH with LU TS -PSA 1.5 in 06/2020 -I PSS 17/4 -PVR 135 mL -cysto 10/2018 NED  2. Pelvic floor dysfunction -managed with pelvic floor exercises and Myrbetriq 50 mg daily  HPI: Adam Ryan is a 60 y.o. male who presents today for yearly follow up.    He states his insurance company will no longer cover Myrbetriq, so he will need to change medications.    He was off the Myrbetriq for a time and his post-void dribbling and his intermittency worsened.    Patient denies any modifying or aggravating factors.  Patient denies any gross hematuria, dysuria or suprapubic/flank pain.  Patient denies any fevers, chills, nausea or vomiting.   His father has a history of bladder cancer and he was a former smoker.    He has noted a retrograde ejaculation.    IPSS    Row Name 07/03/20 0800         International Prostate Symptom Score   How often have you had the sensation of not emptying your bladder? Less than half the time     How often have you had to urinate less than every two hours? About half the time     How often have you found you stopped and started again several times when you urinated? More than half the time     How often have you found it difficult to postpone urination? Less than 1 in 5 times     How often have you had a weak urinary stream? Almost always     How often have you had to strain to start urination? Less than 1 in 5 times     How many times did you typically get up at night to urinate? 1 Time     Total IPSS Score 17           Quality of Life due to urinary symptoms   If you were to spend the rest of your life with your urinary condition just the way it is now how would you feel about that? Mostly  Disatisfied            Score:  1-7 Mild 8-19 Moderate 20-35 Severe     PMH: Past Medical History:  Diagnosis Date  . ADD (attention deficit disorder)   . Fever blister 02/28/2016  . Hyperlipidemia   . Rosacea 04/19/2017  . Trigger ring finger of left hand 04/20/2018    Surgical History: Past Surgical History:  Procedure Laterality Date  . COLONOSCOPY    . COLONOSCOPY WITH PROPOFOL N/A 09/22/2017   Procedure: COLONOSCOPY WITH PROPOFOL;  Surgeon: Robert Bellow, MD;  Location: [facility] ENDOSCOPY;  Service: Endoscopy;  Laterality: N/A;  . OPEN REDUCTION INTERNAL FIXATION (ORIF) HAND Left   . TRIGGER FINGER RELEASE  01/13/2019   left ring finger  . WISDOM TOOTH EXTRACTION  1981    Home Medications:  Allergies as of 07/03/2020   No Known Allergies     Medication List       Accurate as of July 03, 2020  9:14 AM. If you have any questions, ask your nurse or doctor.        STOP taking these medications   mirabegron ER 50 MG Tb24 tablet  Commonly known as: MYRBETRIQ Stopped by: Zara Council, PA-C     TAKE these medications   atorvastatin 20 MG tablet Commonly known as: LIPITOR TAKE 1 TABLET BY MOUTH EVERYDAY AT BEDTIME   escitalopram 10 MG tablet Commonly known as: LEXAPRO Take 10 mg by mouth daily.   Eucrisa 2 % Oint Generic drug: Crisaborole Apply 1 application topically 2 (two) times daily as needed (can use on face for eczema).   fesoterodine 4 MG Tb24 tablet Commonly known as: TOVIAZ Take 1 tablet (4 mg total) by mouth daily. Started by: Zara Council, PA-C   FISH OIL PO Take by mouth.   ketoconazole 2 % cream Commonly known as: NIZORAL Apply 1 application topically 2 (two) times daily.   Melatonin 3-10 MG Tabs Take 2 mg by mouth daily.   minocycline 50 MG capsule Commonly known as: MINOCIN Take 1 capsule (50 mg total) by mouth daily.   multivitamin capsule Take 1 capsule by mouth daily.   pimecrolimus 1 % cream Commonly known as:  ELIDEL APPLY 1 APPLICATION APPLY ON THE SKIN TWICE A DAY FOR RASH ON FACE   tamsulosin 0.4 MG Caps capsule Commonly known as: FLOMAX Take 1 capsule (0.4 mg total) by mouth daily.   traZODone 50 MG tablet Commonly known as: DESYREL Take 25-50 mg by mouth at bedtime.   valACYclovir 1000 MG tablet Commonly known as: VALTREX TAKE 2 TABLETS BY MOUTH AT ONSET OF FEVER BLISTER, THEN TWO MORE TABS 12HRS LATER PER OUTBREAK   Vyvanse 60 MG capsule Generic drug: lisdexamfetamine Take 60 mg by mouth daily.       Allergies: No Known Allergies  Family History: Family History  Problem Relation Age of Onset  . Parkinson's disease Mother   . Heart disease Mother        open heart surgery, staph infection, died 3 months after surg  . Cancer Father        bladder  . Asthma Father   . Diabetes Father   . Hypertension Father   . Heart disease Paternal Grandfather     Social History:  reports that he has quit smoking. His smoking use included cigars. He has never used smokeless tobacco. He reports current alcohol use of about 3.0 standard drinks of alcohol per week. He reports that he does not use drugs.  ROS: Pertinent ROS in HPI  Physical Exam: BP (!) 148/83   Pulse (!) 58   Ht 5\' 4"  (1.626 m)   Wt 150 lb (68 kg)   BMI 25.75 kg/m   Constitutional:  Well nourished. Alert and oriented, No acute distress. HEENT:  AT, mask in place.  Trachea midline Cardiovascular: No clubbing, cyanosis, or edema. Respiratory: Normal respiratory effort, no increased work of breathing. GU: No CVA tenderness.  No bladder fullness or masses.  Patient with circumcised phallus.  Urethral meatus is patent.  No penile discharge. No penile lesions or rashes. Scrotum without lesions, cysts, rashes and/or edema.  Testicles are located scrotally bilaterally. No masses are appreciated in the testicles. Left and right epididymis are normal. Rectal: Patient with  normal sphincter tone. Anus and perineum without  scarring or rashes. No rectal masses are appreciated. Prostate is approximately 40 grams, no nodules are appreciated. Seminal vesicles are normal. Lymph: No inguinal adenopathy. Neurologic: Grossly intact, no focal deficits, moving all 4 extremities. Psychiatric: Normal mood and affect.  Laboratory Data: Lab Results  Component Value Date   PSA 1.2 04/20/2018   PSA 1.4 04/19/2017  I  have reviewed the labs.   Pertinent Imaging: Results for Torrian, Canion PETERSON MATHEY (MRN [id number]) as of 07/03/2020 08:57  Ref. Range 07/03/2020 08:50  Scan Result Unknown 182mL    Assessment & Plan:    1. Pelvic floor dysfunction -Bladder Scan (Post Void Residual) in office -Continue pelvic floor exercises at home -Patient has 2 months of Myrbetriq after his prescription and then we will switch to the Toviaz 4 mg samples to see if they will provide the same improvement in his urinary symptoms as the Myrbetriq  2. BPH with obstruction/lower urinary tract symptoms -symptoms stalble -continue conservative management, avoiding bladder irritants and timed voiding's -Continue tamsulosin 0.4 mg daily-refill sent to pharmacy  3.  Retrograde ejaculation -Explained to the patient this is likely a side effect of the tamsulosin and offered to switch him to tadalafil 5 mg daily -He would like to defer this until his follow-up appointment  Return in about 3 months (around 10/02/2020) for IPSS and PVR.  These notes generated with voice recognition software. I apologize for typographical errors.  Zara Council, PA-C  [facility] [address]  [place], [place] [address] [phone]

## 2020-07-03 ENCOUNTER — Ambulatory Visit: Payer: BC Managed Care – PPO | Admitting: Urology

## 2020-07-03 ENCOUNTER — Encounter: Payer: Self-pay | Admitting: Urology

## 2020-07-03 ENCOUNTER — Other Ambulatory Visit: Payer: Self-pay

## 2020-07-03 VITALS — BP 148/83 | HR 58 | Ht 64.0 in | Wt 150.0 lb

## 2020-07-03 DIAGNOSIS — M6289 Other specified disorders of muscle: Secondary | ICD-10-CM | POA: Diagnosis not present

## 2020-07-03 DIAGNOSIS — N5314 Retrograde ejaculation: Secondary | ICD-10-CM | POA: Diagnosis not present

## 2020-07-03 DIAGNOSIS — N138 Other obstructive and reflux uropathy: Secondary | ICD-10-CM

## 2020-07-03 DIAGNOSIS — N401 Enlarged prostate with lower urinary tract symptoms: Secondary | ICD-10-CM

## 2020-07-03 LAB — BLADDER SCAN AMB NON-IMAGING

## 2020-07-03 MED ORDER — FESOTERODINE FUMARATE ER 4 MG PO TB24: 4.0000 mg | ORAL_TABLET | Freq: Every day | ORAL | 0 refills | Status: DC

## 2020-07-03 MED ORDER — TAMSULOSIN HCL 0.4 MG PO CAPS: 0.4000 mg | ORAL_CAPSULE | Freq: Every day | ORAL | 3 refills | Status: DC

## 2020-07-06 ENCOUNTER — Other Ambulatory Visit: Payer: Self-pay | Admitting: Family Medicine

## 2020-07-06 DIAGNOSIS — E785 Hyperlipidemia, unspecified: Secondary | ICD-10-CM

## 2020-07-10 ENCOUNTER — Ambulatory Visit: Payer: BC Managed Care – PPO | Admitting: Internal Medicine

## 2020-07-26 ENCOUNTER — Encounter: Payer: Self-pay | Admitting: Internal Medicine

## 2020-07-26 ENCOUNTER — Other Ambulatory Visit: Payer: Self-pay | Admitting: Family Medicine

## 2020-07-26 DIAGNOSIS — E785 Hyperlipidemia, unspecified: Secondary | ICD-10-CM

## 2020-08-01 ENCOUNTER — Other Ambulatory Visit: Payer: Self-pay | Admitting: Family Medicine

## 2020-08-01 DIAGNOSIS — E785 Hyperlipidemia, unspecified: Secondary | ICD-10-CM

## 2020-10-07 NOTE — Progress Notes (Signed)
10/08/2020 9:44 AM   Adam Ryan 09-06-1960 [id number]  Referring provider: No referring provider defined for this encounter.  Urological history: 1. BPH with LU TS -PSA 1.5 in 06/2020 -I PSS 17/4 -PVR 78 mL -cysto 10/2018 NED  2. Pelvic floor dysfunction -managed with pelvic floor exercises and Toviaz 4 mg daily  3. Family history of bladder cancer -father with bladder cancer and smoker history   Chief Complaint  Patient presents with   Benign Prostatic Hypertrophy   HPI: Adam Ryan is a 60 y.o. male who presents today for yearly follow up.    He continues to have issues with urination.  He experiences a weak split split urinary stream and difficulty urinating.  He attributes some of this to a shy bladder.    He states the Toviaz 4 mg daily is helping with his urinary symptoms as he notices a difference when he does not take the medication.  He states he he experiences more of the postvoid dribbling.  Patient denies any modifying or aggravating factors.  Patient denies any gross hematuria, dysuria or suprapubic/flank pain.  Patient denies any fevers, chills, nausea or vomiting.    He is still experiencing retrograde ejaculation.    IPSS     Row Name 10/08/20 0800         International Prostate Symptom Score   How often have you had the sensation of not emptying your bladder? Less than half the time     How often have you had to urinate less than every two hours? Less than half the time     How often have you found you stopped and started again several times when you urinated? More than half the time     How often have you found it difficult to postpone urination? Less than 1 in 5 times     How often have you had a weak urinary stream? More than half the time     How often have you had to strain to start urination? Less than half the time     How many times did you typically get up at night to urinate? 2 Times     Total IPSS Score 17            Quality of Life due to urinary symptoms     If you were to spend the rest of your life with your urinary condition just the way it is now how would you feel about that? Mostly Disatisfied              Score:  1-7 Mild 8-19 Moderate 20-35 Severe     PMH: Past Medical History:  Diagnosis Date   ADD (attention deficit disorder)    Fever blister 02/28/2016   Hyperlipidemia    Rosacea 04/19/2017   Trigger ring finger of left hand 04/20/2018    Surgical History: Past Surgical History:  Procedure Laterality Date   COLONOSCOPY     COLONOSCOPY WITH PROPOFOL N/A 09/22/2017   Procedure: COLONOSCOPY WITH PROPOFOL;  Surgeon: Robert Bellow, MD;  Location: [facility];  Service: Endoscopy;  Laterality: N/A;   OPEN REDUCTION INTERNAL FIXATION (ORIF) HAND Left    TRIGGER FINGER RELEASE  01/13/2019   left ring finger   WISDOM TOOTH EXTRACTION  1981    Home Medications:  Allergies as of 10/08/2020   No Known Allergies      Medication List        Accurate as of October 08, 2020  9:44 AM. If you have any questions, ask your nurse or doctor.          STOP taking these medications    fesoterodine 4 MG Tb24 tablet Commonly known as: TOVIAZ Stopped by: Andra Matsuo, PA-C   Vyvanse 60 MG capsule Generic drug: lisdexamfetamine Stopped by: Jahkari Maclin, PA-C       TAKE these medications    atorvastatin 20 MG tablet Commonly known as: LIPITOR TAKE 1 TABLET BY MOUTH EVERYDAY AT BEDTIME   escitalopram 10 MG tablet Commonly known as: LEXAPRO Take 10 mg by mouth daily.   Eucrisa 2 % Oint Generic drug: Crisaborole Apply 1 application topically 2 (two) times daily as needed (can use on face for eczema).   FISH OIL PO Take by mouth.   ketoconazole 2 % cream Commonly known as: NIZORAL Apply 1 application topically 2 (two) times daily.   Melatonin 3-10 MG Tabs Take 2 mg by mouth daily.   minocycline 50 MG capsule Commonly known as: MINOCIN Take 1 capsule  (50 mg total) by mouth daily.   multivitamin capsule Take 1 capsule by mouth daily.   pimecrolimus 1 % cream Commonly known as: ELIDEL APPLY 1 APPLICATION APPLY ON THE SKIN TWICE A DAY FOR RASH ON FACE   tadalafil 5 MG tablet Commonly known as: CIALIS Take 1 tablet (5 mg total) by mouth daily as needed. Take 1 tablet ( 5 mg total) by mouth daily Started by: Maryon Kemnitz, PA-C   tamsulosin 0.4 MG Caps capsule Commonly known as: FLOMAX Take 1 capsule (0.4 mg total) by mouth daily.   traZODone 50 MG tablet Commonly known as: DESYREL Take 25-50 mg by mouth at bedtime.   trospium 20 MG tablet Commonly known as: SANCTURA Take 1 tablet (20 mg total) by mouth at bedtime. Started by: Zara Council, PA-C   valACYclovir 1000 MG tablet Commonly known as: VALTREX TAKE 2 TABLETS BY MOUTH AT ONSET OF FEVER BLISTER, THEN TWO MORE TABS 12HRS LATER PER OUTBREAK        Allergies: No Known Allergies  Family History: Family History  Problem Relation Age of Onset   Parkinson's disease Mother    Heart disease Mother        open heart surgery, staph infection, died 27 months after surg   Cancer Father        bladder   Asthma Father    Diabetes Father    Hypertension Father    Heart disease Paternal Grandfather     Social History:  reports that he has quit smoking. His smoking use included cigars. He has never used smokeless tobacco. He reports current alcohol use of about 3.0 standard drinks of alcohol per week. He reports that he does not use drugs.  ROS: Pertinent ROS in HPI  Physical Exam: BP 127/76   Pulse 77   Ht '5\' 4"'$  (1.626 m)   Wt 150 lb (68 kg)   BMI 25.75 kg/m   Constitutional:  Well nourished. Alert and oriented, No acute distress. HEENT: [place] AT, mask in place  Trachea midline Cardiovascular: No clubbing, cyanosis, or edema. Respiratory: Normal respiratory effort, no increased work of breathing. Neurologic: Grossly intact, no focal deficits, moving all 4  extremities. Psychiatric: Normal mood and affect.   Laboratory Data: Component     Latest Ref Rng & Units 06/28/2019 07/01/2020  Prostate Specific Ag, Serum     0.0 - 4.0 ng/mL 1.2 1.5   Lab Results  Component Value Date  PSA 1.2 04/20/2018   PSA 1.4 04/19/2017  I have reviewed the labs.   Pertinent Imaging: Results for Earline, [name] (MRN [id number]) as of 10/08/2020 09:17  Ref. Range 10/08/2020 08:52  Scan Result Unknown 78     Assessment & Plan:    1. Pelvic floor dysfunction -Bladder Scan (Post Void Residual) in office -Continue pelvic floor exercises at home -Patient's insurance will not cover the Toviaz, so I have sent in a script for trospium 20 mg daily as it is not in the same family medication and his insurance will cover -He would like to take the trospium as a as needed medication, so we reviewed the half-life which is 20 hours with a peak plasma time of 5 to 6 hours, so I stated he could experiment by taking the medication 3 days prior to when he wants it to "work for him" -I have also advised him of the good Rx pricing and contact us if he has cost issues with the medication  2. BPH with obstruction/lower urinary tract symptoms -As he is experiencing retrograde ejaculation with the tamsulosin, we will switch him to tadalafil 5 mg daily  3.  Retrograde ejaculation -We will start tadalafil 5 mg daily in hopes that he will no longer experience retrograde ejaculation, I explained to him that his insurance will require a step therapy approach, but as he is taking tamsulosin in the past this may be a matter of just sending in our notes if requested for medication approval -I will send a prescription in for tadalafil 5 mg daily to his regular pharmacy, but I did advise him of the good Rx pricing and contact us if he has cost issues with that medication  Return in about 1 year (around 10/08/2021) for IPSS, SHIM, PSA, PVR and exam.  These notes generated with voice  recognition software. I apologize for typographical errors.  Zara Council, PA-C  [facility] [address]  [place], [place] [address] [phone]

## 2020-10-08 ENCOUNTER — Encounter: Payer: Self-pay | Admitting: Urology

## 2020-10-08 ENCOUNTER — Other Ambulatory Visit: Payer: Self-pay

## 2020-10-08 ENCOUNTER — Ambulatory Visit: Payer: BC Managed Care – PPO | Admitting: Urology

## 2020-10-08 VITALS — BP 127/76 | HR 77 | Ht 64.0 in | Wt 150.0 lb

## 2020-10-08 DIAGNOSIS — N138 Other obstructive and reflux uropathy: Secondary | ICD-10-CM

## 2020-10-08 DIAGNOSIS — N5314 Retrograde ejaculation: Secondary | ICD-10-CM

## 2020-10-08 DIAGNOSIS — N401 Enlarged prostate with lower urinary tract symptoms: Secondary | ICD-10-CM

## 2020-10-08 DIAGNOSIS — M6289 Other specified disorders of muscle: Secondary | ICD-10-CM | POA: Diagnosis not present

## 2020-10-08 LAB — BLADDER SCAN AMB NON-IMAGING: Scan Result: 78

## 2020-10-08 MED ORDER — TADALAFIL 5 MG PO TABS: 5.0000 mg | ORAL_TABLET | Freq: Every day | ORAL | 3 refills | Status: DC | PRN

## 2020-10-08 MED ORDER — TROSPIUM CHLORIDE 20 MG PO TABS: 20.0000 mg | ORAL_TABLET | Freq: Every day | ORAL | 3 refills | Status: DC

## 2020-10-09 ENCOUNTER — Ambulatory Visit: Payer: BC Managed Care – PPO | Admitting: Internal Medicine

## 2020-10-14 ENCOUNTER — Encounter: Payer: Self-pay | Admitting: Internal Medicine

## 2020-10-14 ENCOUNTER — Ambulatory Visit: Payer: BC Managed Care – PPO | Admitting: Internal Medicine

## 2020-10-14 ENCOUNTER — Other Ambulatory Visit: Payer: Self-pay

## 2020-10-14 VITALS — BP 128/80 | HR 86 | Temp 98.3°F | Ht 64.0 in | Wt 153.0 lb

## 2020-10-14 DIAGNOSIS — F320 Major depressive disorder, single episode, mild: Secondary | ICD-10-CM

## 2020-10-14 DIAGNOSIS — E785 Hyperlipidemia, unspecified: Secondary | ICD-10-CM | POA: Diagnosis not present

## 2020-10-14 DIAGNOSIS — B009 Herpesviral infection, unspecified: Secondary | ICD-10-CM

## 2020-10-14 DIAGNOSIS — F909 Attention-deficit hyperactivity disorder, unspecified type: Secondary | ICD-10-CM | POA: Diagnosis not present

## 2020-10-14 MED ORDER — VALACYCLOVIR HCL 1 G PO TABS: 1000.0000 mg | ORAL_TABLET | Freq: Two times a day (BID) | ORAL | 0 refills | Status: DC

## 2020-10-14 MED ORDER — TRAZODONE HCL 50 MG PO TABS: 25.0000 mg | ORAL_TABLET | Freq: Every day | ORAL | 3 refills | Status: DC

## 2020-10-14 MED ORDER — ATORVASTATIN CALCIUM 20 MG PO TABS: 20.0000 mg | ORAL_TABLET | Freq: Every day | ORAL | 3 refills | Status: DC

## 2020-10-14 NOTE — Progress Notes (Signed)
Date:  10/14/2020   Name:  Adam Ryan   DOB:  [birth year]   MRN:  [id number]   Chief Complaint: Establish Care and Referral (Psychiatrist/)  Hyperlipidemia This is a chronic problem. The problem is controlled. There are no known factors aggravating his hyperlipidemia. Pertinent negatives include no chest pain or shortness of breath. Current antihyperlipidemic treatment includes statins. The current treatment provides significant improvement of lipids. There are no compliance problems.   Depression        This is a chronic problem.The problem is unchanged.  Associated symptoms include no fatigue and no headaches.  Past treatments include SSRIs - Selective serotonin reuptake inhibitors.  Compliance with prior treatments: not currently on medication. ADHD - previously on Vyvanse here in [place] - prescribed by a Psychiatrist.  He has also taken Lexapro but did not think it was beneficial. 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  He would like a refill.  Immunization History  Administered Date(s) Administered   Influenza Inj Mdck Quad Pf 01/28/2017   Influenza,inj,Quad PF,6+ Mos 12/23/2017, 11/22/2018   Influenza-Unspecified 10/30/2014   PFIZER(Purple Top)SARS-COV-2 Vaccination 06/23/2019, 07/18/2019, 01/24/2020   Tdap 10/15/2015   Zoster Recombinat (Shingrix) 04/20/2018, 07/20/2018    Lab Results  Component Value Date   CREATININE 0.93 04/24/2019   BUN 20 04/24/2019   NA 138 04/24/2019   K 4.7 04/24/2019   CL 99 04/24/2019   CO2 32 04/24/2019   Lab Results  Component Value Date   CHOL 190 04/24/2019   HDL 82 04/24/2019   LDLCALC 88 04/24/2019   TRIG 105 04/24/2019   CHOLHDL 2.3 04/24/2019   Lab Results  Component Value Date   TSH 1.11 04/24/2019   Lab Results  Component Value Date   HGBA1C 5.6 04/24/2019   Lab Results  Component Value Date   WBC 7.0 04/24/2019   HGB 14.9 04/24/2019   HCT 43.6 04/24/2019   MCV 92.4 04/24/2019   PLT 319  04/24/2019   Lab Results  Component Value Date   ALT 25 04/24/2019   AST 29 04/24/2019   ALKPHOS 70 11/30/2015   BILITOT 0.5 04/24/2019     Review of Systems  Constitutional:  Negative for chills, fatigue, fever and unexpected weight change.  HENT:  Negative for trouble swallowing.   Respiratory:  Negative for cough, chest tightness, shortness of breath and wheezing.   Cardiovascular:  Negative for chest pain, palpitations and leg swelling.  Gastrointestinal:  Negative for abdominal pain, constipation and diarrhea.  Genitourinary:  Positive for frequency and urgency.  Musculoskeletal:  Positive for arthralgias (knee).  Neurological:  Negative for dizziness and headaches.  Psychiatric/Behavioral:  Positive for depression, dysphoric mood and sleep disturbance. The patient is nervous/anxious.    Patient Active Problem List   Diagnosis Date Noted   Rosacea 04/19/2017   Overweight (BMI 25.0-29.9) 04/19/2017   Hyperlipidemia 11/26/2015   ADD (attention deficit disorder) 02/25/2015   Personal history of colonic polyps 09/07/2012    No Known Allergies  Past Surgical History:  Procedure Laterality Date   COLONOSCOPY     COLONOSCOPY WITH PROPOFOL N/A 09/22/2017   Procedure: COLONOSCOPY WITH PROPOFOL;  Surgeon: Robert Bellow, MD;  Location: [facility] ENDOSCOPY;  Service: Endoscopy;  Laterality: N/A;   OPEN REDUCTION INTERNAL FIXATION (ORIF) HAND Left    TRIGGER FINGER RELEASE  01/13/2019   left ring finger   WISDOM TOOTH EXTRACTION  1981    Social History   Tobacco  Use   Smoking status: Former    Types: Cigars    Quit date: 04/17/2015    Years since quitting: 5.4   Smokeless tobacco: Never  Vaping Use   Vaping Use: Former   Quit date: 07/14/2017  Substance Use Topics   Alcohol use: Yes    Alcohol/week: 2.0 standard drinks    Types: 2 Cans of beer per week    Comment: 2 per week   Drug use: Yes    Types: Marijuana    Comment: once a week     Medication list has been  reviewed and updated.  Current Meds  Medication Sig   atorvastatin (LIPITOR) 20 MG tablet TAKE 1 TABLET BY MOUTH EVERYDAY AT BEDTIME   Cholecalciferol (VITAMIN D3 PO) Take by mouth.   EUCRISA 2 % OINT Apply 1 application topically 2 (two) times daily as needed (can use on face for eczema).   ketoconazole (NIZORAL) 2 % cream Apply 1 application topically 2 (two) times daily.   Melatonin 3-10 MG TABS Take 2 mg by mouth daily.   minocycline (MINOCIN) 50 MG capsule Take 1 capsule (50 mg total) by mouth daily. (Patient taking differently: Take 50 mg by mouth as needed.)   Multiple Vitamin (MULTIVITAMIN) capsule Take 1 capsule by mouth daily.   Omega-3 Fatty Acids (FISH OIL PO) Take by mouth.   pimecrolimus (ELIDEL) 1 % cream APPLY 1 APPLICATION APPLY ON THE SKIN TWICE A DAY FOR RASH ON FACE (Patient taking differently: as needed. APPLY 1 APPLICATION APPLY ON THE SKIN TWICE A DAY FOR RASH ON FACE)   tadalafil (CIALIS) 5 MG tablet Take 1 tablet (5 mg total) by mouth daily as needed. Take 1 tablet ( 5 mg total) by mouth daily   traZODone (DESYREL) 50 MG tablet Take 25-50 mg by mouth at bedtime.   trospium (SANCTURA) 20 MG tablet Take 1 tablet (20 mg total) by mouth at bedtime.   valACYclovir (VALTREX) 1000 MG tablet TAKE 2 TABLETS BY MOUTH AT ONSET OF FEVER BLISTER, THEN TWO MORE TABS 12HRS LATER PER OUTBREAK    PHQ 2/9 Scores 10/14/2020 06/08/2019 04/24/2019 04/20/2018  PHQ - 2 Score 5 0 0 0  PHQ- 9 Score '14 2 5 '$ 0    GAD 7 : Generalized Anxiety Score 10/14/2020  Nervous, Anxious, on Edge 3  Control/stop worrying 2  Worry too much - different things 1  Trouble relaxing 1  Restless 2  Easily annoyed or irritable 1  Afraid - awful might happen 1  Total GAD 7 Score 11    BP Readings from Last 3 Encounters:  10/14/20 128/80  10/08/20 127/76  07/03/20 (!) 148/83    Physical Exam Vitals and nursing note reviewed.  Constitutional:      General: He is not in acute distress.    Appearance: Normal  appearance. He is well-developed.  HENT:     Head: Normocephalic and atraumatic.  Neck:     Vascular: No carotid bruit.  Cardiovascular:     Rate and Rhythm: Normal rate and regular rhythm.     Pulses: Normal pulses.     Heart sounds: No murmur heard. Pulmonary:     Effort: Pulmonary effort is normal. No respiratory distress.     Breath sounds: No wheezing or rhonchi.  Musculoskeletal:     Cervical back: Normal range of motion.     Right lower leg: No edema.     Left lower leg: No edema.  Lymphadenopathy:     Cervical:  No cervical adenopathy.  Skin:    General: Skin is warm and dry.     Capillary Refill: Capillary refill takes less than 2 seconds.     Findings: No rash.  Neurological:     General: No focal deficit present.     Mental Status: He is alert and oriented to person, place, and time.  Psychiatric:        Mood and Affect: Mood normal.        Behavior: Behavior normal.    Wt Readings from Last 3 Encounters:  10/14/20 153 lb (69.4 kg)  10/08/20 150 lb (68 kg)  07/03/20 150 lb (68 kg)    BP 128/80   Pulse 86   Temp 98.3 F (36.8 C) (Oral)   Ht '5\' 4"'$  (1.626 m)   Wt 153 lb (69.4 kg)   SpO2 96%   BMI 26.26 kg/m   Assessment and Plan: 1. Hyperlipidemia, unspecified hyperlipidemia type On statins for many years. No hx of TIA, stroke, CAD or PAD. He is tolerating meds well with no side effects. Will obtain fasting labs next visit - atorvastatin (LIPITOR) 20 MG tablet; Take 1 tablet (20 mg total) by mouth daily.  Dispense: 30 tablet; Refill: 3  2. Current mild episode of major depressive disorder, unspecified whether recurrent ([place]) On trazodone for sleep. He needs to establish with a new Psychiatrist - list of local providers given. - traZODone (DESYREL) 50 MG tablet; Take 0.5-1 tablets (25-50 mg total) by mouth at bedtime.  Dispense: 60 tablet; Refill: 3  3. Attention deficit hyperactivity disorder (ADHD), unspecified ADHD type Previously on Adderall and  then Vyvanse Currently on no treatment - he is employed part time at [employer]; coping well per report. He will establish with a local psych provider.  4. HSV-1 infection Continue PRN valtrex - valACYclovir (VALTREX) 1000 MG tablet; Take 1 tablet (1,000 mg total) by mouth 2 (two) times daily.  Dispense: 30 tablet; Refill: 0   Partially dictated using Editor, commissioning. Any errors are unintentional.  Halina Maidens, MD [facility]  10/14/2020

## 2020-10-23 ENCOUNTER — Other Ambulatory Visit: Payer: Self-pay | Admitting: Internal Medicine

## 2020-10-23 DIAGNOSIS — B009 Herpesviral infection, unspecified: Secondary | ICD-10-CM

## 2020-10-23 NOTE — Telephone Encounter (Signed)
Requested medication (s) are due for refill today: yes   Requested medication (s) are on the active medication list: yes   Last refill:  10/14/2020  Future visit scheduled: yes   Notes to clinic:  Review for refill Short supply given    Requested Prescriptions  Pending Prescriptions Disp Refills   valACYclovir (VALTREX) 1000 MG tablet [Pharmacy Med Name: VALACYCLOVIR HCL 1 GRAM TABLET] 30 tablet 0    Sig: TAKE 1 TABLET BY MOUTH TWICE A DAY      Antimicrobials:  Antiviral Agents - Anti-Herpetic Passed - 10/23/2020  8:32 AM      Passed - Valid encounter within last 12 months    Recent Outpatient Visits           1 week ago Attention deficit hyperactivity disorder (ADHD), unspecified ADHD type   [facility] Glean Hess, MD   1 year ago [facility] Delsa Grana, PA-C   1 year ago Adult general medical exam   [facility] Delsa Grana, PA-C   2 years ago Preventative health care   [place], Satira Anis, MD   3 years ago Annual physical exam   [facility] Lada, Satira Anis, MD       Future Appointments             In 4 months Army Melia, Jesse Sans, MD [facility], [place]   In 11 months McGowan, Gordan Payment [facility]

## 2020-12-07 ENCOUNTER — Other Ambulatory Visit: Payer: Self-pay | Admitting: Internal Medicine

## 2020-12-07 DIAGNOSIS — F320 Major depressive disorder, single episode, mild: Secondary | ICD-10-CM

## 2021-01-17 ENCOUNTER — Ambulatory Visit: Payer: BC Managed Care – PPO | Admitting: Family Medicine

## 2021-02-04 ENCOUNTER — Other Ambulatory Visit: Payer: Self-pay | Admitting: Internal Medicine

## 2021-02-04 DIAGNOSIS — E785 Hyperlipidemia, unspecified: Secondary | ICD-10-CM

## 2021-02-05 NOTE — Telephone Encounter (Signed)
Per note from [place] on 10/14/2020, labs to be drawn next visit. Requested Prescriptions  Pending Prescriptions Disp Refills  . atorvastatin (LIPITOR) 20 MG tablet [Pharmacy Med Name: ATORVASTATIN 20 MG TABLET] 90 tablet 1    Sig: TAKE 1 TABLET BY MOUTH EVERY DAY     Cardiovascular:  Antilipid - Statins Failed - 02/04/2021  6:37 PM      Failed - Total Cholesterol in normal range and within 360 days    Cholesterol, Total  Date Value Ref Range Status  09/26/2014 199 100 - 199 mg/dL Final   Cholesterol  Date Value Ref Range Status  04/24/2019 190 <200 mg/dL Final         Failed - LDL in normal range and within 360 days    LDL Cholesterol (Calc)  Date Value Ref Range Status  04/24/2019 88 mg/dL (calc) Final    Comment:    Reference range: <100 . Desirable range <100 mg/dL for primary prevention;   <70 mg/dL for patients with CHD or diabetic patients  with > or = 2 CHD risk factors. Marland Kitchen LDL-C is now calculated using the Martin-Hopkins  calculation, which is a validated novel method providing  better accuracy than the Friedewald equation in the  estimation of LDL-C.  Cresenciano Genre et al. Annamaria Helling. 0277;412(87): 2061-2068  (http://education.[employer].com/faq/FAQ164)          Failed - HDL in normal range and within 360 days    HDL  Date Value Ref Range Status  04/24/2019 82 > OR = 40 mg/dL Final  09/26/2014 65 >39 mg/dL Final    Comment:    According to ATP-III Guidelines, HDL-C >59 mg/dL is considered a negative risk factor for CHD.          Failed - Triglycerides in normal range and within 360 days    Triglycerides  Date Value Ref Range Status  04/24/2019 105 <150 mg/dL Final         Passed - Patient is not pregnant      Passed - Valid encounter within last 12 months    Recent Outpatient Visits          3 months ago Attention deficit hyperactivity disorder (ADHD), unspecified ADHD type   [facility] Glean Hess, MD   1 year ago [facility] Delsa Grana, PA-C   1 year ago Adult general medical exam   [facility] Delsa Grana, PA-C   2 years ago Preventative health care   [facility] Lada, Satira Anis, MD   3 years ago Annual physical exam   [facility] Lada, Satira Anis, MD      Future Appointments            In 3 weeks Army Melia Jesse Sans, MD [facility], [place]   In 8 months McGowan, Gordan Payment [facility]

## 2021-02-17 ENCOUNTER — Other Ambulatory Visit: Payer: Self-pay | Admitting: Internal Medicine

## 2021-02-17 DIAGNOSIS — F320 Major depressive disorder, single episode, mild: Secondary | ICD-10-CM

## 2021-02-17 NOTE — Telephone Encounter (Signed)
Requested medications are due for refill today.  yes  Requested medications are on the active medications list.  yes  Last refill. 10/14/2020  Future visit scheduled.   yes  Notes to clinic.  Dx code needed for pharmacy.

## 2021-02-26 ENCOUNTER — Other Ambulatory Visit: Payer: Self-pay

## 2021-02-26 ENCOUNTER — Ambulatory Visit (INDEPENDENT_AMBULATORY_CARE_PROVIDER_SITE_OTHER): Payer: BC Managed Care – PPO | Admitting: Internal Medicine

## 2021-02-26 ENCOUNTER — Encounter: Payer: Self-pay | Admitting: Internal Medicine

## 2021-02-26 VITALS — BP 122/78 | HR 60 | Ht 64.0 in | Wt 156.0 lb

## 2021-02-26 DIAGNOSIS — Z0000 Encounter for general adult medical examination without abnormal findings: Secondary | ICD-10-CM | POA: Diagnosis not present

## 2021-02-26 DIAGNOSIS — Z125 Encounter for screening for malignant neoplasm of prostate: Secondary | ICD-10-CM

## 2021-02-26 DIAGNOSIS — E782 Mixed hyperlipidemia: Secondary | ICD-10-CM

## 2021-02-26 DIAGNOSIS — F5101 Primary insomnia: Secondary | ICD-10-CM | POA: Diagnosis not present

## 2021-02-26 DIAGNOSIS — L309 Dermatitis, unspecified: Secondary | ICD-10-CM

## 2021-02-26 NOTE — Progress Notes (Signed)
Date:  02/26/2021   Name:  Adam Ryan   DOB:  12-07-1960   MRN:  [id number]   Chief Complaint: Annual Exam Adam Ryan is a 60 y.o. male who presents today for his Complete Annual Exam. He feels well. He reports exercising- swimming 3 days a week.  He reports he is sleeping well.   Colonoscopy: 09/2017 repeat 38yrs?  Immunization History  Administered Date(s) Administered   Influenza Inj Mdck Quad Pf 01/28/2017   Influenza,inj,Quad PF,6+ Mos 12/23/2017, 11/22/2018   Influenza-Unspecified 10/30/2014, 12/12/2020   PFIZER(Purple Top)SARS-COV-2 Vaccination 06/23/2019, 07/18/2019, 01/24/2020   Tdap 10/15/2015   Zoster Recombinat (Shingrix) 04/20/2018, 07/20/2018    Lab Results  Component Value Date   PSA1 1.5 07/01/2020   PSA1 1.2 06/28/2019   PSA1 2.7 09/26/2014   PSA 1.2 04/20/2018   PSA 1.4 04/19/2017    Hyperlipidemia The problem is controlled. Pertinent negatives include no chest pain, myalgias or shortness of breath. Current antihyperlipidemic treatment includes statins. The current treatment provides significant improvement of lipids. There are no compliance problems.   Insomnia Primary symptoms: no sleep disturbance.   The problem occurs nightly. The problem is unchanged. Treatments tried: trazodone.   Lab Results  Component Value Date   NA 138 04/24/2019   K 4.7 04/24/2019   CO2 32 04/24/2019   GLUCOSE 85 04/24/2019   BUN 20 04/24/2019   CREATININE 0.93 04/24/2019   CALCIUM 9.7 04/24/2019   GFRNONAA 91 04/24/2019   Lab Results  Component Value Date   CHOL 190 04/24/2019   HDL 82 04/24/2019   LDLCALC 88 04/24/2019   TRIG 105 04/24/2019   CHOLHDL 2.3 04/24/2019   Lab Results  Component Value Date   TSH 1.11 04/24/2019   Lab Results  Component Value Date   HGBA1C 5.6 04/24/2019   Lab Results  Component Value Date   WBC 7.0 04/24/2019   HGB 14.9 04/24/2019   HCT 43.6 04/24/2019   MCV 92.4 04/24/2019   PLT 319 04/24/2019   Lab Results   Component Value Date   ALT 25 04/24/2019   AST 29 04/24/2019   ALKPHOS 70 11/30/2015   BILITOT 0.5 04/24/2019   No results found for: 25OHVITD2, 25OHVITD3, VD25OH   Review of Systems  Constitutional:  Negative for appetite change, chills, diaphoresis, fatigue and unexpected weight change.  HENT:  Negative for hearing loss, tinnitus, trouble swallowing and voice change.   Eyes:  Negative for visual disturbance.  Respiratory:  Negative for choking, shortness of breath and wheezing.   Cardiovascular:  Negative for chest pain, palpitations and leg swelling.  Gastrointestinal:  Negative for abdominal pain, blood in stool, constipation and diarrhea.  Genitourinary:  Positive for frequency. Negative for difficulty urinating, dysuria and hematuria.  Musculoskeletal:  Positive for arthralgias (mild left shoulder discomfort at times). Negative for back pain and myalgias.  Skin:  Negative for color change and rash.  Neurological:  Negative for dizziness, syncope and headaches.  Hematological:  Negative for adenopathy.  Psychiatric/Behavioral:  Negative for dysphoric mood and sleep disturbance. The patient has insomnia. The patient is not nervous/anxious.    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   Rosacea 04/19/2017   Overweight (BMI 25.0-29.9) 04/19/2017   Mixed hyperlipidemia 11/26/2015   ADD (attention deficit disorder) 02/25/2015   Personal history of colonic polyps 09/07/2012   Depression 03/16/1998   Anxiety 03/16/1993    No Known Allergies  Past Surgical History:  Procedure Laterality Date   COLONOSCOPY     COLONOSCOPY WITH PROPOFOL N/A 09/22/2017   Procedure: COLONOSCOPY WITH PROPOFOL;  Surgeon: Robert Bellow, MD;  Location: [facility] ENDOSCOPY;  Service: Endoscopy;  Laterality: N/A;   OPEN REDUCTION INTERNAL FIXATION (ORIF) HAND Left    TRIGGER FINGER RELEASE  01/13/2019   left ring finger   WISDOM  TOOTH EXTRACTION  1981    Social History   Tobacco Use   Smoking status: Former    Types: Cigars    Quit date: 04/17/2015    Years since quitting: 5.8   Smokeless tobacco: Never  Vaping Use   Vaping Use: Former   Quit date: 07/14/2017  Substance Use Topics   Alcohol use: Yes    Alcohol/week: 2.0 standard drinks    Types: 2 Cans of beer per week    Comment: 2 per week   Drug use: Yes    Types: Marijuana    Comment: once a week     Medication list has been reviewed and updated.  Current Meds  Medication Sig   atorvastatin (LIPITOR) 20 MG tablet TAKE 1 TABLET BY MOUTH EVERY DAY   Cholecalciferol (VITAMIN D3 PO) Take by mouth.   Melatonin 3-10 MG TABS Take 2 mg by mouth daily.   Multiple Vitamin (MULTIVITAMIN) capsule Take 1 capsule by mouth daily.   Omega-3 Fatty Acids (FISH OIL PO) Take by mouth.   tadalafil (CIALIS) 5 MG tablet Take 1 tablet (5 mg total) by mouth daily as needed. Take 1 tablet ( 5 mg total) by mouth daily   traZODone (DESYREL) 50 MG tablet TAKE 1/2-1 TABLETS (25-50 MG TOTAL) BY MOUTH AT BEDTIME.   trospium (SANCTURA) 20 MG tablet Take 1 tablet (20 mg total) by mouth at bedtime.   valACYclovir (VALTREX) 1000 MG tablet Take 1 tablet (1,000 mg total) by mouth 2 (two) times daily.    PHQ 2/9 Scores 02/26/2021 10/14/2020 06/08/2019 04/24/2019  PHQ - 2 Score 2 5 0 0  PHQ- 9 Score 4 14 2 5     GAD 7 : Generalized Anxiety Score 02/26/2021 10/14/2020  Nervous, Anxious, on Edge 1 3  Control/stop worrying 0 2  Worry too much - different things 0 1  Trouble relaxing 0 1  Restless 1 2  Easily annoyed or irritable 1 1  Afraid - awful might happen 0 1  Total GAD 7 Score 3 11  Anxiety Difficulty Not difficult at all -    BP Readings from Last 3 Encounters:  02/26/21 122/78  10/14/20 128/80  10/08/20 127/76    Physical Exam Vitals and nursing note reviewed.  Constitutional:      Appearance: Normal appearance. He is well-developed.  HENT:     Head: Normocephalic.      Right Ear: Tympanic membrane, ear canal and external ear normal.     Left Ear: Tympanic membrane, ear canal and external ear normal.     Nose: Nose normal.  Eyes:     Conjunctiva/sclera: Conjunctivae normal.     Pupils: Pupils are equal, round, and reactive to light.  Neck:     Thyroid: No thyromegaly.     Vascular: No carotid bruit.  Cardiovascular:     Rate and Rhythm: Normal rate and regular rhythm.     Heart sounds: Normal heart sounds.  Pulmonary:     Effort: Pulmonary effort is normal.     Breath sounds: Normal breath sounds. No wheezing.  Chest:  Breasts:  Right: No mass.     Left: No mass.  Abdominal:     General: Bowel sounds are normal.     Palpations: Abdomen is soft.     Tenderness: There is no abdominal tenderness.  Musculoskeletal:        General: Normal range of motion.     Cervical back: Normal range of motion and neck supple.  Lymphadenopathy:     Cervical: No cervical adenopathy.  Skin:    General: Skin is warm and dry.  Neurological:     Mental Status: He is alert and oriented to person, place, and time.     Deep Tendon Reflexes: Reflexes are normal and symmetric.  Psychiatric:        Attention and Perception: Attention normal.        Mood and Affect: Mood normal.        Thought Content: Thought content normal.    Wt Readings from Last 3 Encounters:  02/26/21 156 lb (70.8 kg)  10/14/20 153 lb (69.4 kg)  10/08/20 150 lb (68 kg)    BP 122/78    Pulse 60    Ht 5\' 4"  (1.626 m)    Wt 156 lb (70.8 kg)    SpO2 99%    BMI 26.78 kg/m   Assessment and Plan: 1. Annual physical exam Normal exam.  Continue regular exercise and healthy diet Up to date on screenings and immunizations. - CBC with Differential/Platelet - Hemoglobin A1c  2. Prostate cancer screening DRE deferred to Urology - PSA  3. Mixed hyperlipidemia Tolerating statin medication without side effects at this time Continue same therapy - will advise on dose change if needed. -  Comprehensive metabolic panel - Lipid panel  4. Primary insomnia Continue Trazodone PRN  5. Eczema, unspecified type Scalp lesions at times. He will contact me for a refill on topical solution and provide the name.   Partially dictated using Editor, commissioning. Any errors are unintentional.  Halina Maidens, MD [facility]  02/26/2021

## 2021-02-27 LAB — COMPREHENSIVE METABOLIC PANEL
ALT: 29 IU/L (ref 0–44)
AST: 27 IU/L (ref 0–40)
Albumin/Globulin Ratio: 1.8 (ref 1.2–2.2)
Albumin: 4.6 g/dL (ref 3.8–4.9)
Alkaline Phosphatase: 87 IU/L (ref 44–121)
BUN/Creatinine Ratio: 17 (ref 9–20)
BUN: 17 mg/dL (ref 6–24)
Bilirubin Total: 0.3 mg/dL (ref 0.0–1.2)
CO2: 27 mmol/L (ref 20–29)
Calcium: 9.3 mg/dL (ref 8.7–10.2)
Chloride: 96 mmol/L (ref 96–106)
Creatinine, Ser: 1.01 mg/dL (ref 0.76–1.27)
Globulin, Total: 2.5 g/dL (ref 1.5–4.5)
Glucose: 109 mg/dL — ABNORMAL HIGH (ref 70–99)
Potassium: 4.6 mmol/L (ref 3.5–5.2)
Sodium: 135 mmol/L (ref 134–144)
Total Protein: 7.1 g/dL (ref 6.0–8.5)
eGFR: 86 mL/min/{1.73_m2} (ref >59)

## 2021-02-27 LAB — LIPID PANEL
Chol/HDL Ratio: 2.8 ratio (ref 0.0–5.0)
Cholesterol, Total: 202 mg/dL — ABNORMAL HIGH (ref 100–199)
HDL: 73 mg/dL (ref >39)
LDL Chol Calc (NIH): 111 mg/dL — ABNORMAL HIGH (ref 0–99)
Triglycerides: 104 mg/dL (ref 0–149)
VLDL Cholesterol Cal: 18 mg/dL (ref 5–40)

## 2021-02-27 LAB — CBC WITH DIFFERENTIAL/PLATELET
Basophils Absolute: 0.1 10*3/uL (ref 0.0–0.2)
Basos: 1 %
EOS (ABSOLUTE): 0.1 10*3/uL (ref 0.0–0.4)
Eos: 2 %
Hematocrit: 45.3 % (ref 37.5–51.0)
Hemoglobin: 16 g/dL (ref 13.0–17.7)
Immature Grans (Abs): 0 10*3/uL (ref 0.0–0.1)
Immature Granulocytes: 0 %
Lymphocytes Absolute: 1.8 10*3/uL (ref 0.7–3.1)
Lymphs: 36 %
MCH: 31.6 pg (ref 26.6–33.0)
MCHC: 35.3 g/dL (ref 31.5–35.7)
MCV: 89 fL (ref 79–97)
Monocytes Absolute: 0.4 10*3/uL (ref 0.1–0.9)
Monocytes: 9 %
Neutrophils Absolute: 2.5 10*3/uL (ref 1.4–7.0)
Neutrophils: 52 %
Platelets: 263 10*3/uL (ref 150–450)
RBC: 5.07 x10E6/uL (ref 4.14–5.80)
RDW: 12.2 % (ref 11.6–15.4)
WBC: 5 10*3/uL (ref 3.4–10.8)

## 2021-02-27 LAB — HEMOGLOBIN A1C
Est. average glucose Bld gHb Est-mCnc: 111 mg/dL
Hgb A1c MFr Bld: 5.5 % (ref 4.8–5.6)

## 2021-02-27 LAB — PSA: Prostate Specific Ag, Serum: 1 ng/mL (ref 0.0–4.0)

## 2021-02-27 NOTE — Progress Notes (Signed)
PEC nurse may give results to patient if they return call to clinic, a CRM has been created.

## 2021-04-22 ENCOUNTER — Other Ambulatory Visit: Payer: Self-pay | Admitting: Internal Medicine

## 2021-04-22 DIAGNOSIS — F320 Major depressive disorder, single episode, mild: Secondary | ICD-10-CM

## 2021-04-22 NOTE — Telephone Encounter (Signed)
° °  Notes to clinic:  Pharm request as follows: Pharmacy comment: REQUEST FOR 90 DAYS PRESCRIPTION. DX Code Needed    Requested Prescriptions  Pending Prescriptions Disp Refills   traZODone (DESYREL) 50 MG tablet [Pharmacy Med Name: TRAZODONE 50 MG TABLET] 90 tablet 1    Sig: TAKE 1/2-1 TABLETS (25-50 MG TOTAL) BY MOUTH AT BEDTIME.     Psychiatry: Antidepressants - Serotonin Modulator Passed - 04/22/2021 12:32 PM      Passed - Completed PHQ-2 or PHQ-9 in the last 360 days      Passed - Valid encounter within last 6 months    Recent Outpatient Visits           1 month ago Annual physical exam   [facility] Glean Hess, MD   6 months ago Attention deficit hyperactivity disorder (ADHD), unspecified ADHD type   [facility] Glean Hess, MD   1 year ago [facility] Delsa Grana, PA-C   1 year ago Adult general medical exam   [facility] Delsa Grana, PA-C   3 years ago Preventative health care   [facility] Lada, Satira Anis, MD       Future Appointments             In 5 months McGowan, Gordan Payment [place]   In 10 months Army Melia, Jesse Sans, MD [facility], [facility]

## 2021-05-07 ENCOUNTER — Other Ambulatory Visit: Payer: Self-pay | Admitting: Internal Medicine

## 2021-05-07 DIAGNOSIS — F320 Major depressive disorder, single episode, mild: Secondary | ICD-10-CM

## 2021-05-07 NOTE — Telephone Encounter (Signed)
Requested medication (s) are due for refill today:   No  Requested medication (s) are on the active medication list:   Yes  Future visit scheduled:   Yes   Last ordered: 04/23/2021 #60, 1 refill  Returned because a 90 day supply is being requested.     Requested Prescriptions  Pending Prescriptions Disp Refills   traZODone (DESYREL) 50 MG tablet [Pharmacy Med Name: TRAZODONE 50 MG TABLET] 90 tablet 2    Sig: TAKE 1/2-1 TABLETS (25-50 MG TOTAL) BY MOUTH AT BEDTIME.     Psychiatry: Antidepressants - Serotonin Modulator Passed - 05/07/2021  8:52 AM      Passed - Completed PHQ-2 or PHQ-9 in the last 360 days      Passed - Valid encounter within last 6 months    Recent Outpatient Visits           2 months ago Annual physical exam   [facility] Glean Hess, MD   6 months ago Attention deficit hyperactivity disorder (ADHD), unspecified ADHD type   [facility] Glean Hess, MD   1 year ago [facility] Delsa Grana, PA-C   2 years ago Adult general medical exam   [facility] Delsa Grana, PA-C   3 years ago Preventative health care   [facility] Lada, Satira Anis, MD       Future Appointments             In 5 months McGowan, Gordan Payment [place]   In 9 months Army Melia, Jesse Sans, MD [facility], [facility]

## 2021-07-25 ENCOUNTER — Other Ambulatory Visit: Payer: Self-pay | Admitting: Internal Medicine

## 2021-07-25 DIAGNOSIS — E785 Hyperlipidemia, unspecified: Secondary | ICD-10-CM

## 2021-07-28 NOTE — Telephone Encounter (Signed)
Requested Prescriptions  ?Pending Prescriptions Disp Refills  ?? atorvastatin (LIPITOR) 20 MG tablet [Pharmacy Med Name: ATORVASTATIN 20 MG TABLET] 90 tablet 1  ?  Sig: TAKE 1 TABLET BY MOUTH EVERY DAY  ?  ? Cardiovascular:  Antilipid - Statins Failed - 07/25/2021  7:19 PM  ?  ?  Failed - Lipid Panel in normal range within the last 12 months  ?  Cholesterol, Total  ?Date Value Ref Range Status  ?02/26/2021 202 (H) 100 - 199 mg/dL Final  ? ?LDL Cholesterol (Calc)  ?Date Value Ref Range Status  ?04/24/2019 88 mg/dL (calc) Final  ?  Comment:  ?  Reference range: <100 ?Marland Kitchen ?Desirable range <100 mg/dL for primary prevention;   ?<70 mg/dL for patients with CHD or diabetic patients  ?with > or = 2 CHD risk factors. ?. ?LDL-C is now calculated using the Martin-Hopkins  ?calculation, which is a validated novel method providing  ?better accuracy than the Friedewald equation in the  ?estimation of LDL-C.  ?Cresenciano Genre et al. Annamaria Helling. 3810;175(10): 2061-2068  ?(http://education.Quest[facility].com/faq/FAQ164) ?  ? ?LDL Chol Calc (NIH)  ?Date Value Ref Range Status  ?02/26/2021 111 (H) 0 - 99 mg/dL Final  ? ?HDL  ?Date Value Ref Range Status  ?02/26/2021 73 >39 mg/dL Final  ? ?Triglycerides  ?Date Value Ref Range Status  ?02/26/2021 104 0 - 149 mg/dL Final  ? ?  ?  ?  Passed - Patient is not pregnant  ?  ?  Passed - Valid encounter within last 12 months  ?  Recent Outpatient Visits   ?      ? 5 months ago Annual physical exam  ? [facility] Glean Hess, MD  ? 9 months ago Attention deficit hyperactivity disorder (ADHD), unspecified ADHD type  ? [facility] Glean Hess, MD  ? 2 years ago Rosacea  ? [facility] Delsa Grana, PA-C  ? 2 years ago Adult general medical exam  ? [facility] Delsa Grana, PA-C  ? 3 years ago Preventative health care  ? [facility] L[facility], Satira Anis, MD  ?  ?  ?Future Appointments   ?        ? In 2 months McGowan, Gordan Payment [place]  ? In 7 months Army Melia Jesse Sans, MD [facility], [place]  ?  ? ?  ?  ?  ? ? ?

## 2021-09-18 ENCOUNTER — Other Ambulatory Visit: Payer: Self-pay | Admitting: Urology

## 2021-09-18 DIAGNOSIS — N138 Other obstructive and reflux uropathy: Secondary | ICD-10-CM

## 2021-09-18 DIAGNOSIS — M6289 Other specified disorders of muscle: Secondary | ICD-10-CM

## 2021-09-30 ENCOUNTER — Other Ambulatory Visit: Payer: BC Managed Care – PPO

## 2021-09-30 DIAGNOSIS — N138 Other obstructive and reflux uropathy: Secondary | ICD-10-CM

## 2021-10-01 LAB — PSA: Prostate Specific Ag, Serum: 1.2 ng/mL (ref 0.0–4.0)

## 2021-10-09 ENCOUNTER — Ambulatory Visit: Payer: BC Managed Care – PPO | Admitting: Urology

## 2021-10-09 ENCOUNTER — Encounter: Payer: Self-pay | Admitting: Urology

## 2021-10-09 VITALS — BP 128/86 | HR 66 | Ht 64.0 in | Wt 162.0 lb

## 2021-10-09 DIAGNOSIS — N138 Other obstructive and reflux uropathy: Secondary | ICD-10-CM

## 2021-10-09 DIAGNOSIS — N401 Enlarged prostate with lower urinary tract symptoms: Secondary | ICD-10-CM | POA: Diagnosis not present

## 2021-10-09 DIAGNOSIS — Z125 Encounter for screening for malignant neoplasm of prostate: Secondary | ICD-10-CM | POA: Diagnosis not present

## 2021-10-09 DIAGNOSIS — M6289 Other specified disorders of muscle: Secondary | ICD-10-CM | POA: Diagnosis not present

## 2021-10-09 LAB — BLADDER SCAN AMB NON-IMAGING

## 2021-10-09 MED ORDER — TADALAFIL 5 MG PO TABS: 5.0000 mg | ORAL_TABLET | Freq: Every day | ORAL | 11 refills | Status: DC | PRN

## 2021-10-09 MED ORDER — TROSPIUM CHLORIDE 20 MG PO TABS: ORAL_TABLET | ORAL | 3 refills | Status: DC

## 2021-10-09 NOTE — Progress Notes (Signed)
10/09/21 9:38 AM   Adam Ryan [birth year] [id number]  Referring provider:  Glean Hess, MD [address] [place],  [place] [address]  Chief Complaint  Patient presents with   Follow-up    Urological history  1. BPH with LU TS -PSA 1.2  on 09/30/2021  -I PSS 19/3 -PVR 108 ml  -cysto 10/2018 NED   2. Pelvic floor dysfunction -pelvic floor exercises  -trospium IR 20 mg qhs   3. Family history of bladder cancer -father with bladder cancer and smoker history   HPI: Adam Ryan is a 61 y.o.male who presents today for yearly follow up .    He reports that he has difficulty urinating and weak stream. He still takes trospium IR 20 mg at night. He reports that he is unable to get tadalafil as his insurance will not cover it.  Patient denies any modifying or aggravating factors.  Patient denies any gross hematuria, dysuria or suprapubic/flank pain.  Patient denies any fevers, chills, nausea or vomiting.    He occassionally gets spontaneous erection he denies curvature or painful erections.      IPSS     Row Name 10/09/21 0900         International Prostate Symptom Score   How often have you had the sensation of not emptying your bladder? About half the time     How often have you had to urinate less than every two hours? More than half the time     How often have you found you stopped and started again several times when you urinated? More than half the time     How often have you found it difficult to postpone urination? Less than half the time     How often have you had a weak urinary stream? More than half the time     How often have you had to strain to start urination? Less than 1 in 5 times     How many times did you typically get up at night to urinate? 1 Time     Total IPSS Score 19       Quality of Life due to urinary symptoms   If you were to spend the rest of your life with your urinary condition just the way it is now how would you feel  about that? Mixed              Score:  1-7 Mild 8-19 Moderate 20-35 Severe   SHIM     Row Name 10/09/21 0908         SHIM: Over the last 6 months:   How do you rate your confidence that you could get and keep an erection? Moderate     When you had erections with sexual stimulation, how often were your erections hard enough for penetration (entering your partner)? Sometimes (about half the time)     During sexual intercourse, how often were you able to maintain your erection after you had penetrated (entered) your partner? Sometimes (about half the time)     During sexual intercourse, how difficult was it to maintain your erection to completion of intercourse? Difficult     When you attempted sexual intercourse, how often was it satisfactory for you? Sometimes (about half the time)       SHIM Total Score   SHIM 15                 PMH: Past Medical History:  Diagnosis Date   ADD (attention deficit disorder)    Anxiety    Colon polyps 2019   Depression    Erectile dysfunction    Fever blister 02/28/2016   Hyperlipidemia    Rosacea 04/19/2017   Trigger ring finger of left hand 04/20/2018    Surgical History: Past Surgical History:  Procedure Laterality Date   COLONOSCOPY     COLONOSCOPY WITH PROPOFOL N/A 09/22/2017   Procedure: COLONOSCOPY WITH PROPOFOL;  Surgeon: Robert Bellow, MD;  Location: [place];  Service: Endoscopy;  Laterality: N/A;   OPEN REDUCTION INTERNAL FIXATION (ORIF) HAND Left    TRIGGER FINGER RELEASE  01/13/2019   left ring finger   WISDOM TOOTH EXTRACTION  1981    Home Medications:  Allergies as of 10/09/2021   No Known Allergies      Medication List        Accurate as of October 09, 2021  9:38 AM. If you have any questions, ask your nurse or doctor.          atorvastatin 20 MG tablet Commonly known as: LIPITOR TAKE 1 TABLET BY MOUTH EVERY DAY   FISH OIL PO Take by mouth.   Melatonin 3-10 MG Tabs Take 2 mg by  mouth daily.   multivitamin capsule Take 1 capsule by mouth daily.   tadalafil 5 MG tablet Commonly known as: CIALIS Take 1 tablet (5 mg total) by mouth daily as needed. Take 1 tablet ( 5 mg total) by mouth daily What changed: Another medication with the same name was added. Make sure you understand how and when to take each.   tadalafil 5 MG tablet Commonly known as: CIALIS Take 1 tablet (5 mg total) by mouth daily as needed for erectile dysfunction. What changed: You were already taking a medication with the same name, and this prescription was added. Make sure you understand how and when to take each.   traZODone 50 MG tablet Commonly known as: DESYREL TAKE 1/2-1 TABLETS (25-50 MG TOTAL) BY MOUTH AT BEDTIME.   trospium 20 MG tablet Commonly known as: SANCTURA TAKE 1 TABLET BY MOUTH EVERYDAY AT BEDTIME   valACYclovir 1000 MG tablet Commonly known as: VALTREX Take 1 tablet (1,000 mg total) by mouth 2 (two) times daily.   VITAMIN D3 PO Take by mouth.        Allergies:  No Known Allergies  Family History: Family History  Problem Relation Age of Onset   Parkinson's disease Mother    Heart disease Mother        open heart surgery, staph infection, died 8 months after surg   Cancer Father        bladder   Asthma Father    Diabetes Father    Hypertension Father    Aneurysm Father    Heart disease Paternal Grandfather     Social History:  reports that he quit smoking about 6 years ago. His smoking use included cigars. He has never used smokeless tobacco. He reports current alcohol use of about 2.0 standard drinks of alcohol per week. He reports current drug use. Drug: Marijuana.   Physical Exam: BP 128/86   Pulse 66   Ht '5\' 4"'$  (1.626 m)   Wt 162 lb (73.5 kg)   BMI 27.81 kg/m   Constitutional:  Alert and oriented, No acute distress. HEENT: [place] AT, moist mucus membranes.  Trachea midline Cardiovascular: No clubbing, cyanosis, or edema. Respiratory: Normal  respiratory effort, no increased work of breathing. GU: No  CVA tenderness.  No bladder fullness or masses.  Patient with circumcised phallus. Urethral meatus is patent.  No penile discharge. No penile lesions or rashes. Scrotum without lesions, cysts, rashes and/or edema.  Bilateral hydroceles, testicles are located scrotally bilaterally. No masses are appreciated in the testicles. Left and right epididymis are normal. Rectal: Patient with  normal sphincter tone. Anus and perineum without scarring or rashes. No rectal masses are appreciated. Prostate is approximately 45 grams, could only palpate the apex, no nodules are appreciated. Seminal vesicles could not be palpated Neurologic: Grossly intact, no focal deficits, moving all 4 extremities. Psychiatric: Normal mood and affect.  Laboratory Data: Lab Results  Component Value Date   CREATININE 1.01 02/26/2021   Lab Results  Component Value Date   HGBA1C 5.5 02/26/2021   CMP     Component Value Date/Time   NA 135 02/26/2021 1110   K 4.6 02/26/2021 1110   CL 96 02/26/2021 1110   CO2 27 02/26/2021 1110   GLUCOSE 109 (H) 02/26/2021 1110   GLUCOSE 85 04/24/2019 1457   BUN 17 02/26/2021 1110   CREATININE 1.01 02/26/2021 1110   CREATININE 0.93 04/24/2019 1457   CALCIUM 9.3 02/26/2021 1110   PROT 7.1 02/26/2021 1110   ALBUMIN 4.6 02/26/2021 1110   AST 27 02/26/2021 1110   ALT 29 02/26/2021 1110   ALKPHOS 87 02/26/2021 1110   BILITOT 0.3 02/26/2021 1110   GFRNONAA 91 04/24/2019 1457   GFRAA 105 04/24/2019 1457   CBC    Component Value Date/Time   WBC 5.0 02/26/2021 1110   WBC 7.0 04/24/2019 1457   RBC 5.07 02/26/2021 1110   RBC 4.72 04/24/2019 1457   HGB 16.0 02/26/2021 1110   HCT 45.3 02/26/2021 1110   PLT 263 02/26/2021 1110   MCV 89 02/26/2021 1110   MCH 31.6 02/26/2021 1110   MCH 31.6 04/24/2019 1457   MCHC 35.3 02/26/2021 1110   MCHC 34.2 04/24/2019 1457   RDW 12.2 02/26/2021 1110   LYMPHSABS 1.8 02/26/2021 1110    MONOABS 0.5 11/30/2015 1114   EOSABS 0.1 02/26/2021 1110   BASOSABS 0.1 02/26/2021 1110   Component     Latest Ref Rng 09/30/2021  Prostate Specific Ag, Serum     0.0 - 4.0 ng/mL 1.2   I have reviewed the labs.   Pertinent Imaging: Results for orders placed or performed in visit on 10/09/21  Bladder Scan (Post Void Residual) in office  Result Value Ref Range   Scan Result 167m     Assessment & Plan:    1. Pelvic floor dysfunction -Bladder Scan (Post Void Residual) in office -Continue pelvic floor exercises at home - continue trospium  2. BPH with obstruction/lower urinary tract symptoms - Insurance did not cover tadalafil previously. Will send in script to [facility]  3. Prostate cancer screening  - PSA low and stable  - DRE benign    Return in about 1 year (around 10/10/2022) for IPSS, SHIM, PSA, UA and exam.  [place] [place] [phone] I, KKirke ShaggyLittlejohn,acting as a scribe for [facility] PA-C.,have documented all relevant documentation on the behalf of Haddie Bruhl, PA-C,as directed by  [facility] PA-C while in the presence of Demontrae Gilbert, PA-C.  I have reviewed the above documentation for accuracy and completeness, and I agree with the above.    SZara Council PA-C

## 2021-10-10 ENCOUNTER — Other Ambulatory Visit: Payer: Self-pay

## 2021-10-10 DIAGNOSIS — N138 Other obstructive and reflux uropathy: Secondary | ICD-10-CM

## 2021-10-31 ENCOUNTER — Other Ambulatory Visit: Payer: Self-pay | Admitting: Internal Medicine

## 2021-10-31 DIAGNOSIS — F320 Major depressive disorder, single episode, mild: Secondary | ICD-10-CM

## 2021-10-31 NOTE — Telephone Encounter (Signed)
Requested medication (s) are due for refill today: yes  Requested medication (s) are on the active medication list: yes  Last refill:  04/23/21 #60 1 refills  Future visit scheduled: yes in 4 months   Notes to clinic:  over due encounter. Do you want courtesy refill? Pharmacy comment: REQUEST FOR 90 DAYS PRESCRIPTION. DX Code Needed     Requested Prescriptions  Pending Prescriptions Disp Refills   traZODone (DESYREL) 50 MG tablet [Pharmacy Med Name: TRAZODONE 50 MG TABLET] 90 tablet 2    Sig: TAKE 1/2-1 TABLETS (25-50 MG TOTAL) BY MOUTH AT BEDTIME.     Psychiatry: Antidepressants - Serotonin Modulator Failed - 10/31/2021  8:32 AM      Failed - Valid encounter within last 6 months    Recent Outpatient Visits           8 months ago Annual physical exam   [place] [facility], Jesse Sans, MD   1 year ago Attention deficit hyperactivity disorder (ADHD), unspecified ADHD type   [place] [facility] at [facility], Jesse Sans, MD   2 years ago [facility] Delsa Grana, PA-C   2 years ago Adult general medical exam   [facility] Delsa Grana, PA-C   3 years ago Preventative health care   [place], MD       Future Appointments             In 4 months Army Melia Jesse Sans, MD [facility] and [facility] Medicine at [facility], [facility]   In 11 months McGowan, Gordan Payment [place] - Completed PHQ-2 or PHQ-9 in the last 360 days

## 2021-11-14 ENCOUNTER — Other Ambulatory Visit: Payer: Self-pay | Admitting: Internal Medicine

## 2021-11-14 DIAGNOSIS — F320 Major depressive disorder, single episode, mild: Secondary | ICD-10-CM

## 2021-11-18 NOTE — Telephone Encounter (Signed)
Requested Prescriptions  Pending Prescriptions Disp Refills  . traZODone (DESYREL) 50 MG tablet [Pharmacy Med Name: TRAZODONE 50 MG TABLET] 90 tablet 1    Sig: TAKE 1/2-1 TABLETS (25-50 MG TOTAL) BY MOUTH AT BEDTIME.     Psychiatry: Antidepressants - Serotonin Modulator Failed - 11/14/2021  9:04 AM      Failed - Valid encounter within last 6 months    Recent Outpatient Visits          8 months ago Annual physical exam   [place] [facility] at [facility], Jesse Sans, MD   1 year ago Attention deficit hyperactivity disorder (ADHD), unspecified ADHD type   [place] [facility] at [facility], Jesse Sans, MD   2 years ago [facility] Delsa Grana, PA-C   2 years ago Adult general medical exam   [facility] Delsa Grana, PA-C   3 years ago Preventative health care   [place], MD      Future Appointments            In 3 months Army Melia Jesse Sans, MD [facility] Primary Care and Sports Medicine at [facility], [employer]   In 10 months McGowan, Gordan Payment [place] - Completed PHQ-2 or PHQ-9 in the last 360 days

## 2021-12-22 ENCOUNTER — Other Ambulatory Visit: Payer: Self-pay | Admitting: Internal Medicine

## 2021-12-22 DIAGNOSIS — F320 Major depressive disorder, single episode, mild: Secondary | ICD-10-CM

## 2021-12-23 NOTE — Telephone Encounter (Signed)
Requested Prescriptions  Pending Prescriptions Disp Refills  . traZODone (DESYREL) 50 MG tablet [Pharmacy Med Name: TRAZODONE 50 MG TABLET] 30 tablet 1    Sig: TAKE 1/2-1 TABLETS (25-50 MG TOTAL) BY MOUTH AT BEDTIME.     Psychiatry: Antidepressants - Serotonin Modulator Failed - 12/22/2021 10:00 AM      Failed - Valid encounter within last 6 months    Recent Outpatient Visits          10 months ago Annual physical exam   [place] [facility] at [facility], Jesse Sans, MD   1 year ago Attention deficit hyperactivity disorder (ADHD), unspecified ADHD type   [place] [facility] at [facility], Jesse Sans, MD   2 years ago [facility] Delsa Grana, PA-C   2 years ago Adult general medical exam   [place] [facility] Delsa Grana, PA-C   3 years ago Preventative health care   [place], MD      Future Appointments            In 2 months Army Melia Jesse Sans, MD [place] at [facility], [facility]   In 9 months McGowan, Shannon A, [place] - Completed PHQ-2 or PHQ-9 in the last 360 days

## 2022-01-15 ENCOUNTER — Other Ambulatory Visit: Payer: Self-pay | Admitting: Internal Medicine

## 2022-01-15 DIAGNOSIS — F320 Major depressive disorder, single episode, mild: Secondary | ICD-10-CM

## 2022-01-15 NOTE — Telephone Encounter (Signed)
Requested medications are due for refill today.  no  Requested medications are on the active medications list.  yes  Last refill. 12/23/2021 #30 1 rf  Future visit scheduled.   yes  Notes to clinic.  Pharmacy comment: REQUEST FOR 90 DAYS PRESCRIPTION. DX Code Needed.     Requested Prescriptions  Pending Prescriptions Disp Refills   traZODone (DESYREL) 50 MG tablet [Pharmacy Med Name: TRAZODONE 50 MG TABLET] 90 tablet 1    Sig: TAKE 1/2-1 TABLETS (25-50 MG TOTAL) BY MOUTH AT BEDTIME.     Psychiatry: Antidepressants - Serotonin Modulator Failed - 01/15/2022  1:32 PM      Failed - Valid encounter within last 6 months    Recent Outpatient Visits           10 months ago Annual physical exam   [place] [facility] at [facility], Jesse Sans, MD   1 year ago Attention deficit hyperactivity disorder (ADHD), unspecified ADHD type   [place] [facility] at [facility], Jesse Sans, MD   2 years ago [facility] Delsa Grana, PA-C   2 years ago Adult general medical exam   [facility] Delsa Grana, PA-C   3 years ago Preventative health care   [place], MD       Future Appointments             In 1 month Army Melia, Jesse Sans, MD [place] at [facility], [facility]   In 9 months McGowan, Shannon A, [place] - Completed PHQ-2 or PHQ-9 in the last 360 days

## 2022-01-21 ENCOUNTER — Other Ambulatory Visit: Payer: Self-pay | Admitting: Internal Medicine

## 2022-01-21 DIAGNOSIS — E785 Hyperlipidemia, unspecified: Secondary | ICD-10-CM

## 2022-01-21 NOTE — Telephone Encounter (Signed)
Requested Prescriptions  Pending Prescriptions Disp Refills   atorvastatin (LIPITOR) 20 MG tablet [Pharmacy Med Name: ATORVASTATIN 20 MG TABLET] 60 tablet 0    Sig: TAKE 1 TABLET BY MOUTH EVERY DAY     Cardiovascular:  Antilipid - Statins Failed - 01/21/2022  2:25 AM      Failed - Lipid Panel in normal range within the last 12 months    Cholesterol, Total  Date Value Ref Range Status  02/26/2021 202 (H) 100 - 199 mg/dL Final   LDL Cholesterol (Calc)  Date Value Ref Range Status  04/24/2019 88 mg/dL (calc) Final    Comment:    Reference range: <100 . Desirable range <100 mg/dL for primary prevention;   <70 mg/dL for patients with CHD or diabetic patients  with > or = 2 CHD risk factors. Marland Kitchen LDL-C is now calculated using the Martin-Hopkins  calculation, which is a validated novel method providing  better accuracy than the Friedewald equation in the  estimation of LDL-C.  Cresenciano Genre et al. Annamaria Helling. 0569;794(80): 2061-2068  (http://education.[facility].com/faq/FAQ164)    LDL Chol Calc (NIH)  Date Value Ref Range Status  02/26/2021 111 (H) 0 - 99 mg/dL Final   HDL  Date Value Ref Range Status  02/26/2021 73 >39 mg/dL Final   Triglycerides  Date Value Ref Range Status  02/26/2021 104 0 - 149 mg/dL Final         Passed - Patient is not pregnant      Passed - Valid encounter within last 12 months    Recent Outpatient Visits           10 months ago Annual physical exam   [place] [facility] at [facility], Jesse Sans, MD   1 year ago Attention deficit hyperactivity disorder (ADHD), unspecified ADHD type    Primary Care [facility] Sports Medicine at [facility], Jesse Sans, MD   2 years ago [facility] Delsa Grana, PA-C   2 years ago Adult general medical exam   [facility] Delsa Grana, PA-C   3 years ago Preventative health care   [employer], MD       Future Appointments             In 1 month Army Melia, Jesse Sans, MD [place] at [facility], [facility]   In 8 months McGowan, Shannon A, [place]

## 2022-03-02 ENCOUNTER — Encounter: Payer: Self-pay | Admitting: Internal Medicine

## 2022-03-02 ENCOUNTER — Ambulatory Visit (INDEPENDENT_AMBULATORY_CARE_PROVIDER_SITE_OTHER): Payer: BC Managed Care – PPO | Admitting: Internal Medicine

## 2022-03-02 VITALS — BP 110/76 | HR 73 | Ht 64.0 in | Wt 154.0 lb

## 2022-03-02 DIAGNOSIS — F320 Major depressive disorder, single episode, mild: Secondary | ICD-10-CM

## 2022-03-02 DIAGNOSIS — N401 Enlarged prostate with lower urinary tract symptoms: Secondary | ICD-10-CM

## 2022-03-02 DIAGNOSIS — M25512 Pain in left shoulder: Secondary | ICD-10-CM

## 2022-03-02 DIAGNOSIS — G8929 Other chronic pain: Secondary | ICD-10-CM

## 2022-03-02 DIAGNOSIS — E782 Mixed hyperlipidemia: Secondary | ICD-10-CM | POA: Diagnosis not present

## 2022-03-02 DIAGNOSIS — Z0000 Encounter for general adult medical examination without abnormal findings: Secondary | ICD-10-CM

## 2022-03-02 DIAGNOSIS — Z114 Encounter for screening for human immunodeficiency virus [HIV]: Secondary | ICD-10-CM

## 2022-03-02 DIAGNOSIS — F5101 Primary insomnia: Secondary | ICD-10-CM | POA: Diagnosis not present

## 2022-03-02 DIAGNOSIS — N138 Other obstructive and reflux uropathy: Secondary | ICD-10-CM

## 2022-03-02 MED ORDER — ATORVASTATIN CALCIUM 20 MG PO TABS: 20.0000 mg | ORAL_TABLET | Freq: Every day | ORAL | 3 refills | Status: DC

## 2022-03-02 MED ORDER — TRAZODONE HCL 50 MG PO TABS: ORAL_TABLET | ORAL | 3 refills | Status: DC

## 2022-03-02 NOTE — Progress Notes (Signed)
Date:  03/02/2022   Name:  Adam Ryan   DOB:  1960-08-21   MRN:  [id number]   Chief Complaint: Annual Exam (Breast exam no pap) Adam Ryan is a 61 y.o. male who presents today for his Complete Annual Exam. He feels well. He reports exercising walking daily. He reports he is sleeping well.   Colonoscopy: 09/2017 repeat 5 yrs  Immunization History  Administered Date(s) Administered   Influenza Inj Mdck Quad Pf 01/28/2017, 01/29/2022   Influenza,inj,Quad PF,6+ Mos 12/23/2017, 11/22/2018   Influenza-Unspecified 10/30/2014, 12/12/2020   PFIZER(Purple Top)SARS-COV-2 Vaccination 06/23/2019, 07/18/2019, 01/24/2020   Tdap 10/15/2015   Zoster Recombinat (Shingrix) 04/20/2018, 07/20/2018   Health Maintenance Due  Topic Date Due   COVID-19 Vaccine (4 - 2023-24 season) 11/14/2021    Lab Results  Component Value Date   PSA1 1.2 09/30/2021   PSA1 1.0 02/26/2021   PSA1 1.5 07/01/2020   PSA 1.2 04/20/2018   PSA 1.4 04/19/2017    Shoulder Pain  The pain is present in the left shoulder. This is a recurrent problem. The current episode started more than 1 year ago. The problem occurs constantly. The problem has been gradually worsening. The quality of the pain is described as aching. The pain is moderate. Associated symptoms include an inability to bear weight and a limited range of motion. The symptoms are aggravated by activity. He has tried movement for the symptoms. The treatment provided mild relief.  Hyperlipidemia This is a chronic problem. The problem is controlled. Pertinent negatives include no chest pain, myalgias or shortness of breath. Current antihyperlipidemic treatment includes statins. The current treatment provides significant improvement of lipids.  Benign Prostatic Hypertrophy This is a chronic problem. The problem is unchanged. Irritative symptoms do not include frequency. Obstructive symptoms include incomplete emptying and a weak stream. Pertinent negatives  include no chills or dysuria. Treatments tried: on Cialis and sanctura. The treatment provided significant relief.    Lab Results  Component Value Date   NA 135 02/26/2021   K 4.6 02/26/2021   CO2 27 02/26/2021   GLUCOSE 109 (H) 02/26/2021   BUN 17 02/26/2021   CREATININE 1.01 02/26/2021   CALCIUM 9.3 02/26/2021   EGFR 86 02/26/2021   GFRNONAA 91 04/24/2019   Lab Results  Component Value Date   CHOL 202 (H) 02/26/2021   HDL 73 02/26/2021   LDLCALC 111 (H) 02/26/2021   TRIG 104 02/26/2021   CHOLHDL 2.8 02/26/2021   Lab Results  Component Value Date   TSH 1.11 04/24/2019   Lab Results  Component Value Date   HGBA1C 5.5 02/26/2021   Lab Results  Component Value Date   WBC 5.0 02/26/2021   HGB 16.0 02/26/2021   HCT 45.3 02/26/2021   MCV 89 02/26/2021   PLT 263 02/26/2021   Lab Results  Component Value Date   ALT 29 02/26/2021   AST 27 02/26/2021   ALKPHOS 87 02/26/2021   BILITOT 0.3 02/26/2021   No results found for: "25OHVITD2", "25OHVITD3", "VD25OH"   Review of Systems  Constitutional:  Negative for appetite change, chills, diaphoresis, fatigue and unexpected weight change.  HENT:  Negative for hearing loss, tinnitus, trouble swallowing and voice change.   Eyes:  Negative for visual disturbance.  Respiratory:  Negative for choking, shortness of breath and wheezing.   Cardiovascular:  Negative for chest pain, palpitations and leg swelling.  Gastrointestinal:  Negative for abdominal pain, blood in stool, constipation and diarrhea.  Genitourinary:  Positive for  decreased urine volume and incomplete emptying. Negative for difficulty urinating, dysuria and frequency.  Musculoskeletal:  Positive for arthralgias (left shoulder). Negative for back pain and myalgias.  Skin:  Negative for color change and rash.  Neurological:  Negative for dizziness, syncope and headaches.  Hematological:  Negative for adenopathy.  Psychiatric/Behavioral:  Positive for sleep  disturbance. Negative for dysphoric mood. The patient is not nervous/anxious.     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   Rosacea 04/19/2017   Overweight (BMI 25.0-29.9) 04/19/2017   Mixed hyperlipidemia 11/26/2015   ADD (attention deficit disorder) 02/25/2015   Personal history of colonic polyps 09/07/2012   Depression 03/16/1998   Anxiety 03/16/1993    No Known Allergies  Past Surgical History:  Procedure Laterality Date   COLONOSCOPY     COLONOSCOPY WITH PROPOFOL N/A 09/22/2017   Procedure: COLONOSCOPY WITH PROPOFOL;  Surgeon: Robert Bellow, MD;  Location: [facility];  Service: Endoscopy;  Laterality: N/A;   OPEN REDUCTION INTERNAL FIXATION (ORIF) HAND Left    TRIGGER FINGER RELEASE  01/13/2019   left ring finger   WISDOM TOOTH EXTRACTION  1981    Social History   Tobacco Use   Smoking status: Former    Types: Cigars    Quit date: 04/17/2015    Years since quitting: 6.8   Smokeless tobacco: Never  Vaping Use   Vaping Use: Former   Quit date: 07/14/2017  Substance Use Topics   Alcohol use: Yes    Alcohol/week: 2.0 standard drinks of alcohol    Types: 2 Cans of beer per week    Comment: 2 per week   Drug use: Yes    Types: Marijuana    Comment: once a week     Medication list has been reviewed and updated.  Current Meds  Medication Sig   atorvastatin (LIPITOR) 20 MG tablet TAKE 1 TABLET BY MOUTH EVERY DAY   Cholecalciferol (VITAMIN D3 PO) Take by mouth.   Melatonin 3-10 MG TABS Take 2 mg by mouth daily.   Multiple Vitamin (MULTIVITAMIN) capsule Take 1 capsule by mouth daily.   Omega-3 Fatty Acids (FISH OIL PO) Take by mouth.   tadalafil (CIALIS) 5 MG tablet Take 1 tablet (5 mg total) by mouth daily as needed. Take 1 tablet ( 5 mg total) by mouth daily   traZODone (DESYREL) 50 MG tablet TAKE 1/2-1 TABLETS (25-50 MG TOTAL) BY MOUTH AT BEDTIME.   trospium (SANCTURA) 20 MG tablet  TAKE 1 TABLET BY MOUTH EVERYDAY AT BEDTIME   valACYclovir (VALTREX) 1000 MG tablet Take 1 tablet (1,000 mg total) by mouth 2 (two) times daily. (Patient taking differently: Take 1,000 mg by mouth as needed.)       02/26/2021   10:34 AM 10/14/2020    2:04 PM  GAD 7 : Generalized Anxiety Score  Nervous, Anxious, on Edge 1 3  Control/stop worrying 0 2  Worry too much - different things 0 1  Trouble relaxing 0 1  Restless 1 2  Easily annoyed or irritable 1 1  Afraid - awful might happen 0 1  Total GAD 7 Score 3 11  Anxiety Difficulty Not difficult at all        02/26/2021   10:34 AM 10/14/2020    2:04 PM 06/08/2019   11:53 AM  Depression screen PHQ 2/9  Decreased Interest 1 3 0  Down, Depressed, Hopeless 1 2 0  PHQ -  2 Score 2 5 0  Altered sleeping 0 2 1  Tired, decreased energy 0 2 1  Change in appetite 0 0 0  Feeling bad or failure about yourself  1 1 0  Trouble concentrating 1 3 0  Moving slowly or fidgety/restless 0 1 0  Suicidal thoughts 0 0 0  PHQ-9 Score _0 Difficult doing work/chores Somewhat difficult Somewhat difficult Not difficult at all    BP Readings from Last 3 Encounters:  03/02/22 110/76  10/09/21 128/86  02/26/21 122/78    Physical Exam Vitals and nursing note reviewed.  Constitutional:      Appearance: Normal appearance. He is well-developed.  HENT:     Head: Normocephalic.     Right Ear: Tympanic membrane, ear canal and external ear normal.     Left Ear: Tympanic membrane, ear canal and external ear normal.     Nose: Nose normal.  Eyes:     Conjunctiva/sclera: Conjunctivae normal.     Pupils: Pupils are equal, round, and reactive to light.  Neck:     Thyroid: No thyromegaly.     Vascular: No carotid bruit.  Cardiovascular:     Rate and Rhythm: Normal rate and regular rhythm.     Heart sounds: Normal heart sounds.  Pulmonary:     Effort: Pulmonary effort is normal.     Breath sounds: Normal breath sounds. No wheezing.  Chest:   Breasts:    Right: No mass.     Left: No mass.  Abdominal:     General: Bowel sounds are normal.     Palpations: Abdomen is soft.     Tenderness: There is no abdominal tenderness.  Musculoskeletal:     Cervical back: Normal range of motion and neck supple.     Right lower leg: No edema.     Left lower leg: No edema.  Lymphadenopathy:     Cervical: No cervical adenopathy.  Skin:    General: Skin is warm and dry.     Capillary Refill: Capillary refill takes less than 2 seconds.  Neurological:     General: No focal deficit present.     Mental Status: He is alert and oriented to person, place, and time.     Deep Tendon Reflexes: Reflexes are normal and symmetric.  Psychiatric:        Attention and Perception: Attention normal.        Mood and Affect: Mood normal.        Thought Content: Thought content normal.     Wt Readings from Last 3 Encounters:  03/02/22 154 lb (69.9 kg)  10/09/21 162 lb (73.5 kg)  02/26/21 156 lb (70.8 kg)    BP 110/76   Pulse 73   Ht _1  (1.626 m)   Wt 154 lb (69.9 kg)   SpO2 95%   BMI 26.43 kg/m   Assessment and Plan: 1. Annual physical exam Normal exam Recommend continued regular exercise, health diet - Comprehensive metabolic panel - Lipid panel - CBC with Differential/Platelet  2. Mixed hyperlipidemia On statin therapy without side effect - Comprehensive metabolic panel - Lipid panel - atorvastatin (LIPITOR) 20 MG tablet; Take 1 tablet (20 mg total) by mouth daily.  Dispense: 90 tablet; Refill: 3  3. Primary insomnia Doing well with trazodone for sleep - traZODone (DESYREL) 50 MG tablet; TAKE 1/2-1 TABLETS (25-50 MG TOTAL) BY MOUTH AT BEDTIME.  Dispense: 90 tablet; Refill: 3  4. Current mild episode of major depressive disorder,  unspecified whether recurrent ([place]) Has been on medications in the past but currently doing well. No SI/HI or other concerns  5. Encounter for screening for HIV - HIV Antibody (routine testing w  rflx)  6. Chronic left shoulder pain - Ambulatory referral to Orthopedic Surgery  7. BPH with obstruction/lower urinary tract symptoms Followed by Urology Recent PSA was normal. On Cialis and Sanctura   Partially dictated using Editor, commissioning. Any errors are unintentional.  Halina Maidens, MD [facility]  03/02/2022

## 2022-03-03 LAB — CBC WITH DIFFERENTIAL/PLATELET
Basophils Absolute: 0 10*3/uL (ref 0.0–0.2)
Basos: 1 %
EOS (ABSOLUTE): 0.1 10*3/uL (ref 0.0–0.4)
Eos: 3 %
Hematocrit: 45.3 % (ref 37.5–51.0)
Hemoglobin: 15.8 g/dL (ref 13.0–17.7)
Immature Grans (Abs): 0 10*3/uL (ref 0.0–0.1)
Immature Granulocytes: 0 %
Lymphocytes Absolute: 1.4 10*3/uL (ref 0.7–3.1)
Lymphs: 33 %
MCH: 31 pg (ref 26.6–33.0)
MCHC: 34.9 g/dL (ref 31.5–35.7)
MCV: 89 fL (ref 79–97)
Monocytes Absolute: 0.3 10*3/uL (ref 0.1–0.9)
Monocytes: 7 %
Neutrophils Absolute: 2.4 10*3/uL (ref 1.4–7.0)
Neutrophils: 56 %
Platelets: 286 10*3/uL (ref 150–450)
RBC: 5.1 x10E6/uL (ref 4.14–5.80)
RDW: 12.7 % (ref 11.6–15.4)
WBC: 4.2 10*3/uL (ref 3.4–10.8)

## 2022-03-03 LAB — COMPREHENSIVE METABOLIC PANEL
ALT: 24 IU/L (ref 0–44)
AST: 25 IU/L (ref 0–40)
Albumin/Globulin Ratio: 2.1 (ref 1.2–2.2)
Albumin: 4.6 g/dL (ref 3.8–4.9)
Alkaline Phosphatase: 100 IU/L (ref 44–121)
BUN/Creatinine Ratio: 21 (ref 10–24)
BUN: 21 mg/dL (ref 8–27)
Bilirubin Total: 0.5 mg/dL (ref 0.0–1.2)
CO2: 26 mmol/L (ref 20–29)
Calcium: 9.3 mg/dL (ref 8.6–10.2)
Chloride: 101 mmol/L (ref 96–106)
Creatinine, Ser: 0.98 mg/dL (ref 0.76–1.27)
Globulin, Total: 2.2 g/dL (ref 1.5–4.5)
Glucose: 106 mg/dL — ABNORMAL HIGH (ref 70–99)
Potassium: 4.9 mmol/L (ref 3.5–5.2)
Sodium: 139 mmol/L (ref 134–144)
Total Protein: 6.8 g/dL (ref 6.0–8.5)
eGFR: 88 mL/min/{1.73_m2} (ref >59)

## 2022-03-03 LAB — HIV ANTIBODY (ROUTINE TESTING W REFLEX): HIV Screen 4th Generation wRfx: NONREACTIVE

## 2022-03-03 LAB — LIPID PANEL
Chol/HDL Ratio: 2.4 ratio (ref 0.0–5.0)
Cholesterol, Total: 179 mg/dL (ref 100–199)
HDL: 75 mg/dL (ref >39)
LDL Chol Calc (NIH): 94 mg/dL (ref 0–99)
Triglycerides: 50 mg/dL (ref 0–149)
VLDL Cholesterol Cal: 10 mg/dL (ref 5–40)

## 2022-04-01 DIAGNOSIS — M25512 Pain in left shoulder: Secondary | ICD-10-CM | POA: Insufficient documentation

## 2022-05-12 ENCOUNTER — Ambulatory Visit: Payer: BC Managed Care – PPO | Admitting: Internal Medicine

## 2022-05-12 ENCOUNTER — Encounter: Payer: Self-pay | Admitting: Internal Medicine

## 2022-05-12 ENCOUNTER — Other Ambulatory Visit: Payer: Self-pay | Admitting: Internal Medicine

## 2022-05-12 VITALS — BP 116/68 | HR 58 | Ht 64.0 in | Wt 154.0 lb

## 2022-05-12 DIAGNOSIS — R21 Rash and other nonspecific skin eruption: Secondary | ICD-10-CM

## 2022-05-12 DIAGNOSIS — M19019 Primary osteoarthritis, unspecified shoulder: Secondary | ICD-10-CM

## 2022-05-12 MED ORDER — PREDNISONE 10 MG PO TABS: ORAL_TABLET | ORAL | 0 refills | Status: AC

## 2022-05-12 MED ORDER — TRIAMCINOLONE ACETONIDE 0.1 % EX CREA: TOPICAL_CREAM | Freq: Two times a day (BID) | CUTANEOUS | 0 refills | Status: DC

## 2022-05-12 NOTE — Progress Notes (Signed)
Date:  05/12/2022   Name:  Adam Ryan   DOB:  08-Aug-1960   MRN:  [id number]   Chief Complaint: Rash (Started X 1 week ago. Rash on back of neck, forehead, both arms, and lower back.)  Rash This is a recurrent problem. The affected locations include the neck, head and back. The rash is characterized by itchiness and redness. He was exposed to nothing. Pertinent negatives include no shortness of breath. Past treatments include nothing.    Lab Results  Component Value Date   NA 139 03/02/2022   K 4.9 03/02/2022   CO2 26 03/02/2022   GLUCOSE 106 (H) 03/02/2022   BUN 21 03/02/2022   CREATININE 0.98 03/02/2022   CALCIUM 9.3 03/02/2022   EGFR 88 03/02/2022   GFRNONAA 91 04/24/2019   Lab Results  Component Value Date   CHOL 179 03/02/2022   HDL 75 03/02/2022   LDLCALC 94 03/02/2022   TRIG 50 03/02/2022   CHOLHDL 2.4 03/02/2022   Lab Results  Component Value Date   TSH 1.11 04/24/2019   Lab Results  Component Value Date   HGBA1C 5.5 02/26/2021   Lab Results  Component Value Date   WBC 4.2 03/02/2022   HGB 15.8 03/02/2022   HCT 45.3 03/02/2022   MCV 89 03/02/2022   PLT 286 03/02/2022   Lab Results  Component Value Date   ALT 24 03/02/2022   AST 25 03/02/2022   ALKPHOS 100 03/02/2022   BILITOT 0.5 03/02/2022   No results found for: "25OHVITD2", "25OHVITD3", "VD25OH"   Review of Systems  Constitutional:  Negative for chills.  HENT:  Negative for trouble swallowing.   Eyes:  Negative for visual disturbance.  Respiratory:  Negative for chest tightness, shortness of breath and wheezing.   Cardiovascular:  Negative for chest pain.  Gastrointestinal:  Negative for abdominal pain.  Skin:  Positive for rash.  Psychiatric/Behavioral:  Negative for dysphoric mood and sleep disturbance. The patient is not nervous/anxious.     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   Rosacea 04/19/2017   Overweight (BMI 25.0-29.9) 04/19/2017   Mixed hyperlipidemia 11/26/2015   ADD (attention deficit disorder) 02/25/2015   Personal history of colonic polyps 09/07/2012   Depression 03/16/1998   Anxiety 03/16/1993    No Known Allergies  Past Surgical History:  Procedure Laterality Date   COLONOSCOPY     COLONOSCOPY WITH PROPOFOL N/A 09/22/2017   Procedure: COLONOSCOPY WITH PROPOFOL;  Surgeon: Robert Bellow, MD;  Location: [facility];  Service: Endoscopy;  Laterality: N/A;   OPEN REDUCTION INTERNAL FIXATION (ORIF) HAND Left    TRIGGER FINGER RELEASE  01/13/2019   left ring finger   WISDOM TOOTH EXTRACTION  1981    Social History   Tobacco Use   Smoking status: Former    Types: Cigars    Quit date: 04/17/2015    Years since quitting: 7.0   Smokeless tobacco: Never  Vaping Use   Vaping Use: Former   Quit date: 07/14/2017  Substance Use Topics   Alcohol use: Yes    Alcohol/week: 2.0 standard drinks of alcohol    Types: 2 Cans of beer per week    Comment: 2 per week   Drug use: Yes    Types: Marijuana    Comment: once a week     Medication list has been reviewed and updated.  Current Meds  Medication Sig   atorvastatin (LIPITOR) 20 MG tablet Take 1 tablet (20 mg total) by mouth daily.   Cholecalciferol (VITAMIN D3 PO) Take by mouth.   Melatonin 3-10 MG TABS Take 2 mg by mouth daily.   meloxicam (MOBIC) 15 MG tablet Take 15 mg by mouth daily.   Multiple Vitamin (MULTIVITAMIN) capsule Take 1 capsule by mouth daily.   Omega-3 Fatty Acids (FISH OIL PO) Take by mouth.   predniSONE (DELTASONE) 10 MG tablet Take 6 tablets (60 mg total) by mouth daily with breakfast for 1 day, THEN 5 tablets (50 mg total) daily with breakfast for 1 day, THEN 4 tablets (40 mg total) daily with breakfast for 1 day, THEN 3 tablets (30 mg total) daily with breakfast for 1 day, THEN 2 tablets (20 mg total) daily with breakfast for 1 day, THEN 1  tablet (10 mg total) daily with breakfast for 1 day.   tadalafil (CIALIS) 5 MG tablet Take 1 tablet (5 mg total) by mouth daily as needed. Take 1 tablet ( 5 mg total) by mouth daily   traZODone (DESYREL) 50 MG tablet TAKE 1/2-1 TABLETS (25-50 MG TOTAL) BY MOUTH AT BEDTIME.   triamcinolone 0.1%-Cetaphil equivalent 1:1 cream mixture Apply topically 2 (two) times daily.   trospium (SANCTURA) 20 MG tablet TAKE 1 TABLET BY MOUTH EVERYDAY AT BEDTIME   valACYclovir (VALTREX) 1000 MG tablet Take 1 tablet (1,000 mg total) by mouth 2 (two) times daily. (Patient taking differently: Take 1,000 mg by mouth as needed.)   [DISCONTINUED] tamsulosin (FLOMAX) 0.4 MG CAPS capsule        05/12/2022   10:54 AM 03/02/2022    8:32 AM 02/26/2021   10:34 AM 10/14/2020    2:04 PM  GAD 7 : Generalized Anxiety Score  Nervous, Anxious, on Edge '1 1 1 3  '$ Control/stop worrying 1 1 0 2  Worry too much - different things 1 1 0 1  Trouble relaxing 0 0 0 1  Restless '1 1 1 2  '$ Easily annoyed or irritable '1 1 1 1  '$ Afraid - awful might happen 1 0 0 1  Total GAD 7 Score '6 5 3 11  '$ Anxiety Difficulty Somewhat difficult Somewhat difficult Not difficult at all        05/12/2022   10:53 AM 03/02/2022    8:31 AM 02/26/2021   10:34 AM  Depression screen PHQ 2/9  Decreased Interest '1 1 1  '$ Down, Depressed, Hopeless '1 1 1  '$ PHQ - 2 Score '2 2 2  '$ Altered sleeping 0 0 0  Tired, decreased energy 1 1 0  Change in appetite 0 0 0  Feeling bad or failure about yourself  0 1 1  Trouble concentrating '2 1 1  '$ Moving slowly or fidgety/restless 0 0 0  Suicidal thoughts 0 0 0  PHQ-9 Score '5 5 4  '$ Difficult doing work/chores Not difficult at all Not difficult at all Somewhat difficult    BP Readings from Last 3 Encounters:  05/12/22 116/68  03/02/22 110/76  10/09/21 128/86    Physical Exam Vitals and nursing note reviewed.  Constitutional:      General: He is not in acute distress.    Appearance: He is well-developed.  HENT:      Head: Normocephalic and atraumatic.  Cardiovascular:     Rate and Rhythm: Normal rate and regular rhythm.  Pulmonary:     Effort: Pulmonary effort is normal. No respiratory distress.     Breath  sounds: No wheezing or rhonchi.  Musculoskeletal:     Cervical back: Normal range of motion.  Lymphadenopathy:     Cervical: No cervical adenopathy.  Skin:    General: Skin is warm and dry.     Findings: Rash present.     Comments: Scattered red macules over face, neck, arms, chest and upper back  Neurological:     Mental Status: He is alert and oriented to person, place, and time.  Psychiatric:        Mood and Affect: Mood normal.        Behavior: Behavior normal.     Wt Readings from Last 3 Encounters:  05/12/22 154 lb (69.9 kg)  03/02/22 154 lb (69.9 kg)  10/09/21 162 lb (73.5 kg)    BP 116/68   Pulse (!) 58   Ht '5\' 4"'$  (1.626 m)   Wt 154 lb (69.9 kg)   SpO2 98%   BMI 26.43 kg/m   Assessment and Plan: Problem List Items Addressed This Visit       Musculoskeletal and Integument   Primary osteoarthritis of shoulder    Treated by [facility] On Mobic daily Recommend taking with food and monitoring for GI side effects      Relevant Medications   meloxicam (MOBIC) 15 MG tablet   predniSONE (DELTASONE) 10 MG tablet   Other Visit Diagnoses     Rash and nonspecific skin eruption    -  Primary   Relevant Medications   predniSONE (DELTASONE) 10 MG tablet   triamcinolone 0.1%-Cetaphil equivalent 1:1 cream mixture        Partially dictated using Editor, commissioning. Any errors are unintentional.  Halina Maidens, MD [facility]  05/12/2022

## 2022-05-12 NOTE — Telephone Encounter (Signed)
FYI

## 2022-05-12 NOTE — Assessment & Plan Note (Signed)
Treated by [facility] On Mobic daily Recommend taking with food and monitoring for GI side effects

## 2022-05-13 ENCOUNTER — Other Ambulatory Visit: Payer: Self-pay | Admitting: Internal Medicine

## 2022-05-13 DIAGNOSIS — R21 Rash and other nonspecific skin eruption: Secondary | ICD-10-CM

## 2022-05-13 MED ORDER — TRIAMCINOLONE ACETONIDE 0.025 % EX CREA: 1.0000 | TOPICAL_CREAM | Freq: Two times a day (BID) | CUTANEOUS | 0 refills | Status: DC

## 2022-05-16 ENCOUNTER — Other Ambulatory Visit: Payer: Self-pay | Admitting: Internal Medicine

## 2022-05-16 DIAGNOSIS — B009 Herpesviral infection, unspecified: Secondary | ICD-10-CM

## 2022-05-18 ENCOUNTER — Ambulatory Visit: Payer: Self-pay

## 2022-05-18 MED ORDER — VALACYCLOVIR HCL 1 G PO TABS: 1000.0000 mg | ORAL_TABLET | Freq: Two times a day (BID) | ORAL | 0 refills | Status: AC

## 2022-05-18 NOTE — Telephone Encounter (Signed)
  Chief Complaint: Dermatitis continues - unable to see dermatologist until MAY Symptoms: 4 red burning red spots on face. Pt thinks he may have RUI. Frequency: resolved and new spots came back Pertinent Negatives: Patient denies  Disposition: '[]'$ ED /'[]'$ Urgent Care (no appt availability in office) / '[]'$ Appointment(In office/virtual)/ '[]'$  [facility]/ '[]'$ Home Care/ '[]'$ Refused Recommended Disposition /'[]'$ [facility]/ '[]'$  Follow-up with PCP Additional Notes: Pt states that the red spots resolved with prednisone, but have now returned. PT states they spots burn and he has dry lips. PT also states that he may have an URI. PT states he has chills and perhaps a fever.     Summary: dermatis concerns   Pt is calling to report that he is still having concerns with his dermatis is. Please address        Reason for Disposition  [1] Localized rash is very painful AND [2] no fever  Answer Assessment - Initial Assessment Questions 1. APPEARANCE of RASH: "Describe the rash."      Red spots on face 2. LOCATION: "Where is the rash located?"      face 3. NUMBER: "How many spots are there?"      4 4. SIZE: "How big are the spots?" (Inches, centimeters or compare to size of a coin)      Penny sized 5. ONSET: "When did the rash start?"      2 weeks - had it before a long time ago 6. ITCHING: "Does the rash itch?" If Yes, ask: "How bad is the itch?"  (Scale 0-10; or none, mild, moderate, severe)     Burns 7. PAIN: "Does the rash hurt?" If Yes, ask: "How bad is the pain?"  (Scale 0-10; or none, mild, moderate, severe)    - NONE (0): no pain    - MILD (1-3): doesn't interfere with normal activities     - MODERATE (4-7): interferes with normal activities or awakens from sleep     - SEVERE (8-10): excruciating pain, unable to do any normal activities      8. OTHER SYMPTOMS: "Do you have any other symptoms?" (e.g., fever)     Lips are dry  Protocols used: Rash or Redness -  Localized-A-AH

## 2022-05-19 ENCOUNTER — Encounter: Payer: Self-pay | Admitting: Physician Assistant

## 2022-05-19 ENCOUNTER — Ambulatory Visit: Payer: BC Managed Care – PPO | Admitting: Physician Assistant

## 2022-05-19 VITALS — BP 128/64 | HR 85 | Temp 98.1°F | Ht 64.0 in | Wt 151.0 lb

## 2022-05-19 DIAGNOSIS — L719 Rosacea, unspecified: Secondary | ICD-10-CM

## 2022-05-19 DIAGNOSIS — J069 Acute upper respiratory infection, unspecified: Secondary | ICD-10-CM

## 2022-05-19 DIAGNOSIS — R21 Rash and other nonspecific skin eruption: Secondary | ICD-10-CM

## 2022-05-19 MED ORDER — METRONIDAZOLE 0.75 % EX CREA: TOPICAL_CREAM | Freq: Two times a day (BID) | CUTANEOUS | 0 refills | Status: DC

## 2022-05-19 NOTE — Patient Instructions (Signed)
-  stop triamcinolon -start topical metronidazole -treat cold symptoms with OTC meds and let us know if worsening

## 2022-05-19 NOTE — Progress Notes (Signed)
Date:  05/19/2022   Name:  Adam Ryan   DOB:  08/18/1960   MRN:  [id number]   Chief Complaint: Rash and Cough  Rash This is a new problem. Episode onset: X2 weeks. The problem has been gradually improving since onset. The affected locations include the face. The rash is characterized by burning, dryness, redness and itchiness. He was exposed to a new medication (prednisone). Associated symptoms include coughing and rhinorrhea. Pertinent negatives include no shortness of breath. Past treatments include nothing.  Cough This is a new problem. Episode onset: X3-4 days. The problem has been gradually worsening. The problem occurs every few minutes. The cough is Productive of sputum (clear). Associated symptoms include a rash and rhinorrhea. Pertinent negatives include no chest pain, shortness of breath or wheezing. He has tried nothing for the symptoms.   Adam Ryan is a pleasant 62 year old male with a history of rosacea, eczema, and seborrheic dermatitis new to me today here for follow-up on a rash last evaluated 1 week ago by my colleague Dr. Army Melia and treated with prednisone taper and topical triamcinolone cream with good effect.  However he stopped prednisone 1-2 days ago and noticed return of rash vs new rash eruption on the face.  He thinks it might be rosacea flare based on prior experience. He has made an appointment with dermatology, but they are not able to see him until May.  Probably unrelated, he endorses a cough which seems to be worsening over the last day or 2 but he is not using any OTC medications for this problem.  No fever, chest pain, dyspnea.  Lab Results  Component Value Date   NA 139 03/02/2022   K 4.9 03/02/2022   CO2 26 03/02/2022   GLUCOSE 106 (H) 03/02/2022   BUN 21 03/02/2022   CREATININE 0.98 03/02/2022   CALCIUM 9.3 03/02/2022   EGFR 88 03/02/2022   GFRNONAA 91 04/24/2019   Lab Results  Component Value Date   CHOL 179 03/02/2022   HDL 75 03/02/2022    LDLCALC 94 03/02/2022   TRIG 50 03/02/2022   CHOLHDL 2.4 03/02/2022   Lab Results  Component Value Date   TSH 1.11 04/24/2019   Lab Results  Component Value Date   HGBA1C 5.5 02/26/2021   Lab Results  Component Value Date   WBC 4.2 03/02/2022   HGB 15.8 03/02/2022   HCT 45.3 03/02/2022   MCV 89 03/02/2022   PLT 286 03/02/2022   Lab Results  Component Value Date   ALT 24 03/02/2022   AST 25 03/02/2022   ALKPHOS 100 03/02/2022   BILITOT 0.5 03/02/2022   No results found for: "25OHVITD2", "25OHVITD3", "VD25OH"   Review of Systems  HENT:  Positive for rhinorrhea.   Respiratory:  Positive for cough. Negative for shortness of breath and wheezing.   Cardiovascular:  Negative for chest pain.  Skin:  Positive for rash.    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   Rosacea 04/19/2017   Overweight (BMI 25.0-29.9) 04/19/2017   Mixed hyperlipidemia 11/26/2015   ADD (attention deficit disorder) 02/25/2015   Personal history of colonic polyps 09/07/2012   Depression 03/16/1998   Anxiety 03/16/1993    No Known Allergies  Past Surgical History:  Procedure Laterality Date   COLONOSCOPY     COLONOSCOPY WITH PROPOFOL N/A 09/22/2017   Procedure: COLONOSCOPY WITH PROPOFOL;  Surgeon:  Robert Bellow, MD;  Location: [facility] ENDOSCOPY;  Service: Endoscopy;  Laterality: N/A;   OPEN REDUCTION INTERNAL FIXATION (ORIF) HAND Left    TRIGGER FINGER RELEASE  01/13/2019   left ring finger   WISDOM TOOTH EXTRACTION  1981    Social History   Tobacco Use   Smoking status: Former    Types: Cigars    Quit date: 04/17/2015    Years since quitting: 7.0   Smokeless tobacco: Never  Vaping Use   Vaping Use: Former   Quit date: 07/14/2017  Substance Use Topics   Alcohol use: Yes    Alcohol/week: 2.0 standard drinks of alcohol    Types: 2 Cans of beer per week    Comment: 2  per week   Drug use: Yes    Types: Marijuana    Comment: once a week     Medication list has been reviewed and updated.  Current Meds  Medication Sig   atorvastatin (LIPITOR) 20 MG tablet Take 1 tablet (20 mg total) by mouth daily.   Cholecalciferol (VITAMIN D3 PO) Take by mouth.   meloxicam (MOBIC) 15 MG tablet Take 15 mg by mouth daily.   metroNIDAZOLE (METROCREAM) 0.75 % cream Apply topically 2 (two) times daily.   Multiple Vitamin (MULTIVITAMIN) capsule Take 1 capsule by mouth daily.   Omega-3 Fatty Acids (FISH OIL PO) Take by mouth.   tadalafil (CIALIS) 5 MG tablet Take 1 tablet (5 mg total) by mouth daily as needed. Take 1 tablet ( 5 mg total) by mouth daily   traZODone (DESYREL) 50 MG tablet TAKE 1/2-1 TABLETS (25-50 MG TOTAL) BY MOUTH AT BEDTIME.   triamcinolone (KENALOG) 0.025 % cream Apply 1 Application topically 2 (two) times daily.   trospium (SANCTURA) 20 MG tablet TAKE 1 TABLET BY MOUTH EVERYDAY AT BEDTIME   valACYclovir (VALTREX) 1000 MG tablet Take 1 tablet (1,000 mg total) by mouth 2 (two) times daily.   [DISCONTINUED] Melatonin 3-10 MG TABS Take 2 mg by mouth daily.       05/19/2022    8:01 AM 05/12/2022   10:54 AM 03/02/2022    8:32 AM 02/26/2021   10:34 AM  GAD 7 : Generalized Anxiety Score  Nervous, Anxious, on Edge '1 1 1 1  '$ Control/stop worrying '1 1 1 '$ 0  Worry too much - different things '1 1 1 '$ 0  Trouble relaxing 0 0 0 0  Restless '1 1 1 1  '$ Easily annoyed or irritable '1 1 1 1  '$ Afraid - awful might happen 1 1 0 0  Total GAD 7 Score '6 6 5 3  '$ Anxiety Difficulty Somewhat difficult Somewhat difficult Somewhat difficult Not difficult at all       05/19/2022    8:01 AM 05/12/2022   10:53 AM 03/02/2022    8:31 AM  Depression screen PHQ 2/9  Decreased Interest '1 1 1  '$ Down, Depressed, Hopeless '1 1 1  '$ PHQ - 2 Score '2 2 2  '$ Altered sleeping 0 0 0  Tired, decreased energy '1 1 1  '$ Change in appetite 0 0 0  Feeling bad or failure about yourself  0 0 1  Trouble  concentrating '2 2 1  '$ Moving slowly or fidgety/restless 0 0 0  Suicidal thoughts 0 0 0  PHQ-9 Score '5 5 5  '$ Difficult doing work/chores Not difficult at all Not difficult at all Not difficult at all    BP Readings from Last 3 Encounters:  05/19/22 128/64  05/12/22 116/68  03/02/22  110/76    Physical Exam Constitutional:      Appearance: Normal appearance.  Cardiovascular:     Rate and Rhythm: Normal rate and regular rhythm.  Pulmonary:     Effort: Pulmonary effort is normal.     Breath sounds: Normal breath sounds. No wheezing or rales.  Skin:    Findings: Rash present.     Comments: Examination of the face reveals 10-15 scattered erythematous macules across the face without scale or excoriation.  One of these macules located at the right earlobe is slightly weeping.  They are nontender to palpation.  Facial telangiectasias noted. Lips are chapped. There is no scaling or flaking of the scalp.   Neurological:     Mental Status: He is alert.     Wt Readings from Last 3 Encounters:  05/19/22 151 lb (68.5 kg)  05/12/22 154 lb (69.9 kg)  03/02/22 154 lb (69.9 kg)    BP 128/64   Pulse 85   Temp 98.1 F (36.7 C) (Oral)   Ht '5\' 4"'$  (1.626 m)   Wt 151 lb (68.5 kg)   SpO2 98%   BMI 25.92 kg/m   Assessment and Plan:  Problem List Items Addressed This Visit       Musculoskeletal and Integument   Rosacea   Relevant Medications   metroNIDAZOLE (METROCREAM) 0.75 % cream   Other Visit Diagnoses     Rash and nonspecific skin eruption    -  Primary   Possibly rosacea flare but macules appear more consistent w/ seb derm to me.  Try topical metronidazole, compare w/ topical triamcinolone, keep derm appt   Acute URI       Patient reassured no concerning features.  Offered viral testing, but utility limited with mild symptoms so we did not proceed       URI is likely viral etiology. Discussed self-limited nature of viral illnesses and advised conservative measures including  rest, fluids, and OTC cough/cold medications. Contact precautions advised to limit spread. Encouraged mask wearing and good hand hygiene especially before meals. Call if acutely worsening symptoms or if no improvement in 5 days  Partially dictated using Editor, commissioning. Any errors are unintentional.  Lupita Leash, Hershal Coria, [facility]  05/19/2022

## 2022-05-20 ENCOUNTER — Ambulatory Visit: Payer: Self-pay

## 2022-05-20 NOTE — Telephone Encounter (Signed)
     Chief Complaint: Rash to face is worse, spreading. Seen in office yesterday. Symptoms: Itchy Frequency:  Pertinent Negatives: Patient denies  Disposition: '[]'$ ED /'[]'$ Urgent Care (no appt availability in office) / '[x]'$ Appointment(In office/virtual)/ '[]'$  [facility]/ '[]'$ Home Care/ '[]'$ Refused Recommended Disposition /'[]'$ [facility]/ '[]'$  Follow-up with PCP Additional Notes: Go to ED for worsening of symptoms.   Reason for Disposition . [1] Looks infected (spreading redness, pus) AND [2] large red area (> 2 in. or 5 cm)  Answer Assessment - Initial Assessment Questions 1. APPEARANCE of RASH: "Describe the rash."      Red, spreading 2. LOCATION: "Where is the rash located?"      Face - near eyes and cheeks 3. NUMBER: "How many spots are there?"       4. SIZE: "How big are the spots?" (Inches, centimeters or compare to size of a coin)       5. ONSET: "When did the rash start?"      Last week 6. ITCHING: "Does the rash itch?" If Yes, ask: "How bad is the itch?"  (Scale 0-10; or none, mild, moderate, severe)     Yes 7. PAIN: "Does the rash hurt?" If Yes, ask: "How bad is the pain?"  (Scale 0-10; or none, mild, moderate, severe)    - NONE (0): no pain    - MILD (1-3): doesn't interfere with normal activities     - MODERATE (4-7): interferes with normal activities or awakens from sleep     - SEVERE (8-10): excruciating pain, unable to do any normal activities     Burning 8. OTHER SYMPTOMS: "Do you have any other symptoms?" (e.g., fever)     No 9. PREGNANCY: "Is there any chance you are pregnant?" "When was your last menstrual period?"     N/a  Protocols used: Rash or Redness - Localized-A-AH

## 2022-05-21 ENCOUNTER — Encounter: Payer: Self-pay | Admitting: Physician Assistant

## 2022-05-21 ENCOUNTER — Ambulatory Visit: Payer: BC Managed Care – PPO | Admitting: Physician Assistant

## 2022-05-21 VITALS — BP 126/80 | HR 86 | Temp 97.6°F | Ht 64.0 in | Wt 152.0 lb

## 2022-05-21 DIAGNOSIS — L219 Seborrheic dermatitis, unspecified: Secondary | ICD-10-CM

## 2022-05-21 MED ORDER — DESONIDE 0.05 % EX CREA: TOPICAL_CREAM | Freq: Two times a day (BID) | CUTANEOUS | 0 refills | Status: AC

## 2022-05-21 MED ORDER — PREDNISONE 10 MG PO TABS: ORAL_TABLET | ORAL | 0 refills | Status: AC

## 2022-05-21 NOTE — Progress Notes (Signed)
Date:  05/21/2022   Name:  Adam Ryan   DOB:  1961-03-03   MRN:  [id number]   Chief Complaint: Rash and Cough (Cough, congestion, sore throat. No SOB or fever. X 4 days)  Rash This is a recurrent problem. The current episode started 1 to 4 weeks ago. The affected locations include the face and neck. The rash is characterized by burning, redness and itchiness. Associated symptoms include congestion, coughing and a sore throat. Pertinent negatives include no shortness of breath. Treatments tried: prednisone, metro cream, and kenolog cream. The treatment provided significant relief.   Adam Ryan returns for repeat evaluation of facial rash last seen 2 days ago by me and prescribed metronidazole which he states was not effective.  Rash seems to be worsening.  Lips are swollen and chapped.  He has also been using fragrance-free hypoallergenic Aveeno moisturizing lotion on the face and Chapstick.  (PRIOR NOTE 05/19/22):  Adam Ryan is a pleasant 62 year old male with a history of rosacea, eczema, and seborrheic dermatitis new to me today here for follow-up on a rash last evaluated 1 week ago by my colleague Dr. Army Melia and treated with prednisone taper and topical triamcinolone cream with good effect.  However he stopped prednisone 1-2 days ago and noticed return of rash vs new rash eruption on the face.  He thinks it might be rosacea flare based on prior experience. He has made an appointment with dermatology, but they are not able to see him until May.  Probably unrelated, he endorses a cough which seems to be worsening over the last day or 2 but he is not using any OTC medications for this problem.  No fever, chest pain, dyspnea.   Lab Results  Component Value Date   NA 139 03/02/2022   K 4.9 03/02/2022   CO2 26 03/02/2022   GLUCOSE 106 (H) 03/02/2022   BUN 21 03/02/2022   CREATININE 0.98 03/02/2022   CALCIUM 9.3 03/02/2022   EGFR 88 03/02/2022   GFRNONAA 91 04/24/2019   Lab Results  Component  Value Date   CHOL 179 03/02/2022   HDL 75 03/02/2022   LDLCALC 94 03/02/2022   TRIG 50 03/02/2022   CHOLHDL 2.4 03/02/2022   Lab Results  Component Value Date   TSH 1.11 04/24/2019   Lab Results  Component Value Date   HGBA1C 5.5 02/26/2021   Lab Results  Component Value Date   WBC 4.2 03/02/2022   HGB 15.8 03/02/2022   HCT 45.3 03/02/2022   MCV 89 03/02/2022   PLT 286 03/02/2022   Lab Results  Component Value Date   ALT 24 03/02/2022   AST 25 03/02/2022   ALKPHOS 100 03/02/2022   BILITOT 0.5 03/02/2022   No results found for: "25OHVITD2", "25OHVITD3", "VD25OH"   Review of Systems  HENT:  Positive for congestion and sore throat.   Respiratory:  Positive for cough. Negative for shortness of breath.   Skin:  Positive for rash.    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   Rosacea 04/19/2017   Overweight (BMI 25.0-29.9) 04/19/2017   Mixed hyperlipidemia 11/26/2015   ADD (attention deficit disorder) 02/25/2015   Personal history of colonic polyps 09/07/2012   Depression 03/16/1998   Anxiety 03/16/1993    Allergies  Allergen Reactions   Metrocream [Metronidazole] Rash    Past Surgical History:  Procedure Laterality Date   COLONOSCOPY     COLONOSCOPY WITH PROPOFOL N/A 09/22/2017   Procedure: COLONOSCOPY WITH PROPOFOL;  Surgeon: Robert Bellow, MD;  Location: [facility] ENDOSCOPY;  Service: Endoscopy;  Laterality: N/A;   OPEN REDUCTION INTERNAL FIXATION (ORIF) HAND Left    TRIGGER FINGER RELEASE  01/13/2019   left ring finger   WISDOM TOOTH EXTRACTION  1981    Social History   Tobacco Use   Smoking status: Former    Types: Cigars    Quit date: 04/17/2015    Years since quitting: 7.0   Smokeless tobacco: Never  Vaping Use   Vaping Use: Former   Quit date: 07/14/2017  Substance Use Topics   Alcohol use:  Yes    Alcohol/week: 2.0 standard drinks of alcohol    Types: 2 Cans of beer per week    Comment: 2 per week   Drug use: Yes    Types: Marijuana    Comment: once a week     Medication list has been reviewed and updated.  Current Meds  Medication Sig   atorvastatin (LIPITOR) 20 MG tablet Take 1 tablet (20 mg total) by mouth daily.   Cholecalciferol (VITAMIN D3 PO) Take by mouth.   desonide (DESOWEN) 0.05 % cream Apply topically 2 (two) times daily.   meloxicam (MOBIC) 15 MG tablet Take 15 mg by mouth daily.   Multiple Vitamin (MULTIVITAMIN) capsule Take 1 capsule by mouth daily.   Omega-3 Fatty Acids (FISH OIL PO) Take by mouth.   predniSONE (DELTASONE) 10 MG tablet Take 6 tablets (60 mg total) by mouth daily with breakfast for 1 day, THEN 5 tablets (50 mg total) daily with breakfast for 1 day, THEN 4 tablets (40 mg total) daily with breakfast for 1 day, THEN 3 tablets (30 mg total) daily with breakfast for 1 day, THEN 2 tablets (20 mg total) daily with breakfast for 1 day, THEN 1 tablet (10 mg total) daily with breakfast for 1 day.   tadalafil (CIALIS) 5 MG tablet Take 1 tablet (5 mg total) by mouth daily as needed. Take 1 tablet ( 5 mg total) by mouth daily   traZODone (DESYREL) 50 MG tablet TAKE 1/2-1 TABLETS (25-50 MG TOTAL) BY MOUTH AT BEDTIME.   trospium (SANCTURA) 20 MG tablet TAKE 1 TABLET BY MOUTH EVERYDAY AT BEDTIME   valACYclovir (VALTREX) 1000 MG tablet Take 1 tablet (1,000 mg total) by mouth 2 (two) times daily.   [DISCONTINUED] metroNIDAZOLE (METROCREAM) 0.75 % cream Apply topically 2 (two) times daily.   [DISCONTINUED] triamcinolone (KENALOG) 0.025 % cream Apply 1 Application topically 2 (two) times daily.       05/19/2022    8:01 AM 05/12/2022   10:54 AM 03/02/2022    8:32 AM 02/26/2021   10:34 AM  GAD 7 : Generalized Anxiety Score  Nervous, Anxious, on Edge '1 1 1 1  '$ Control/stop worrying '1 1 1 '$ 0  Worry too much - different things '1 1 1 '$ 0  Trouble relaxing 0 0 0 0   Restless '1 1 1 1  '$ Easily annoyed or irritable '1 1 1 1  '$ Afraid - awful might happen 1 1 0 0  Total GAD 7 Score '6 6 5 3  '$ Anxiety Difficulty Somewhat difficult Somewhat difficult Somewhat difficult Not difficult at all       05/19/2022    8:01 AM 05/12/2022   10:53 AM 03/02/2022    8:31 AM  Depression screen PHQ 2/9  Decreased Interest 1 1  1  Down, Depressed, Hopeless '1 1 1  '$ PHQ - 2 Score '2 2 2  '$ Altered sleeping 0 0 0  Tired, decreased energy '1 1 1  '$ Change in appetite 0 0 0  Feeling bad or failure about yourself  0 0 1  Trouble concentrating '2 2 1  '$ Moving slowly or fidgety/restless 0 0 0  Suicidal thoughts 0 0 0  PHQ-9 Score '5 5 5  '$ Difficult doing work/chores Not difficult at all Not difficult at all Not difficult at all    BP Readings from Last 3 Encounters:  05/21/22 126/80  05/19/22 128/64  05/12/22 116/68    Physical Exam Constitutional:      Appearance: Normal appearance.  Skin:    Findings: Rash present.     Comments: Examination of the face reveals 10-15 scattered erythematous macules and patches across the face with yellowish scale not seen at last exam, perhaps most noticeable on a 1.5 cm patch just lateral to the RIGHT orbital rim. The skin is nontender to palpation.  Facial telangiectasias noted. Lips are chapped and inflamed. There is no scaling or flaking of the scalp.      Wt Readings from Last 3 Encounters:  05/21/22 152 lb (68.9 kg)  05/19/22 151 lb (68.5 kg)  05/12/22 154 lb (69.9 kg)    BP 126/80   Pulse 86   Temp 97.6 F (36.4 C) (Oral)   Ht '5\' 4"'$  (1.626 m)   Wt 152 lb (68.9 kg)   SpO2 99%   BMI 26.09 kg/m   Assessment and Plan:  Problem List Items Addressed This Visit       Musculoskeletal and Integument   Seborrheic dermatitis - Primary    Characteristic appearance of seborrheic dermatitis on exam today.  Repeat prednisone taper, also start desonide cream 1-2x daily while awaiting dermatology referral. This replaces triamcinolone.    I do not think he had allergic response to metronidazole, but it does seem like it was ineffective for the current rash, so pause for now.  Advised patient he may spot-test this medication in the future once the current presentation has resolved to see if any skin reaction      Relevant Medications   predniSONE (DELTASONE) 10 MG tablet   desonide (DESOWEN) 0.05 % cream    F/u PRN  Partially dictated using Editor, commissioning. Any errors are unintentional.  Lupita Leash, Hershal Coria, [facility]  05/21/2022

## 2022-05-21 NOTE — Assessment & Plan Note (Addendum)
Characteristic appearance of seborrheic dermatitis on exam today.  Repeat prednisone taper, also start desonide cream 1-2x daily while awaiting dermatology referral. This replaces triamcinolone.   I do not think he had allergic response to metronidazole, but it does seem like it was ineffective for the current rash, so pause for now.  Advised patient he may spot-test this medication in the future once the current presentation has resolved to see if any skin reaction

## 2022-05-25 ENCOUNTER — Other Ambulatory Visit: Payer: Self-pay | Admitting: Internal Medicine

## 2022-05-25 DIAGNOSIS — B009 Herpesviral infection, unspecified: Secondary | ICD-10-CM

## 2022-05-28 ENCOUNTER — Encounter: Payer: Self-pay | Admitting: Internal Medicine

## 2022-05-28 ENCOUNTER — Ambulatory Visit: Payer: BC Managed Care – PPO | Admitting: Internal Medicine

## 2022-05-28 VITALS — BP 112/60 | HR 96 | Ht 64.0 in | Wt 151.0 lb

## 2022-05-28 DIAGNOSIS — L219 Seborrheic dermatitis, unspecified: Secondary | ICD-10-CM | POA: Diagnosis not present

## 2022-05-28 NOTE — Assessment & Plan Note (Signed)
Responded well to steroid taper - completed 2 days ago Recommend using Desonide bid to active lesions to avoid flare-up Keep Dermatology appointment for ongoing management

## 2022-05-28 NOTE — Progress Notes (Signed)
Date:  05/28/2022   Name:  Adam Ryan   DOB:  [birth year]   MRN:  [id number]   Chief Complaint: Dermatitis (Doing better. Still has rash on right arm. But much better and just wanted to follow up. )  Rash This is a chronic problem. The problem has been gradually improving since onset. The affected locations include the head and right arm. Pertinent negatives include no fatigue, fever or shortness of breath.  He is just finishing the steroid taper.  Using Desonide 0.05% topically.  Has Derm appointment in May. His rash is much improved.  He has not been using the Desonide for the past few days.  He still has small lesions on his right arm, ear lobe and left temple.  Lab Results  Component Value Date   NA 139 03/02/2022   K 4.9 03/02/2022   CO2 26 03/02/2022   GLUCOSE 106 (H) 03/02/2022   BUN 21 03/02/2022   CREATININE 0.98 03/02/2022   CALCIUM 9.3 03/02/2022   EGFR 88 03/02/2022   GFRNONAA 91 04/24/2019   Lab Results  Component Value Date   CHOL 179 03/02/2022   HDL 75 03/02/2022   LDLCALC 94 03/02/2022   TRIG 50 03/02/2022   CHOLHDL 2.4 03/02/2022   Lab Results  Component Value Date   TSH 1.11 04/24/2019   Lab Results  Component Value Date   HGBA1C 5.5 02/26/2021   Lab Results  Component Value Date   WBC 4.2 03/02/2022   HGB 15.8 03/02/2022   HCT 45.3 03/02/2022   MCV 89 03/02/2022   PLT 286 03/02/2022   Lab Results  Component Value Date   ALT 24 03/02/2022   AST 25 03/02/2022   ALKPHOS 100 03/02/2022   BILITOT 0.5 03/02/2022   No results found for: "25OHVITD2", "25OHVITD3", "VD25OH"   Review of Systems  Constitutional:  Negative for chills, fatigue and fever.  Respiratory:  Negative for chest tightness and shortness of breath.   Cardiovascular:  Negative for chest pain and leg swelling.  Skin:  Positive for rash.  Psychiatric/Behavioral:  Negative for dysphoric mood and sleep disturbance. The patient is not nervous/anxious.     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   Rosacea 04/19/2017   Overweight (BMI 25.0-29.9) 04/19/2017   Mixed hyperlipidemia 11/26/2015   ADD (attention deficit disorder) 02/25/2015   Personal history of colonic polyps 09/07/2012   Depression 03/16/1998   Anxiety 03/16/1993    Allergies  Allergen Reactions   Metrocream [Metronidazole] Rash    Past Surgical History:  Procedure Laterality Date   COLONOSCOPY     COLONOSCOPY WITH PROPOFOL N/A 09/22/2017   Procedure: COLONOSCOPY WITH PROPOFOL;  Surgeon: Robert Bellow, MD;  Location: [facility];  Service: Endoscopy;  Laterality: N/A;   OPEN REDUCTION INTERNAL FIXATION (ORIF) HAND Left    TRIGGER FINGER RELEASE  01/13/2019   left ring finger   WISDOM TOOTH EXTRACTION  1981    Social History   Tobacco Use   Smoking status: Former    Types: Cigars    Quit date: 04/17/2015    Years since quitting: 7.1   Smokeless tobacco: Never  Vaping Use   Vaping Use: Former   Quit date: 07/14/2017  Substance Use Topics   Alcohol use: Yes    Alcohol/week: 2.0 standard drinks of alcohol  Types: 2 Cans of beer per week    Comment: 2 per week   Drug use: Yes    Types: Marijuana    Comment: once a week     Medication list has been reviewed and updated.  Current Meds  Medication Sig   atorvastatin (LIPITOR) 20 MG tablet Take 1 tablet (20 mg total) by mouth daily.   Cholecalciferol (VITAMIN D3 PO) Take by mouth.   desonide (DESOWEN) 0.05 % cream Apply topically 2 (two) times daily.   meloxicam (MOBIC) 15 MG tablet Take 15 mg by mouth daily.   Multiple Vitamin (MULTIVITAMIN) capsule Take 1 capsule by mouth daily.   Omega-3 Fatty Acids (FISH OIL PO) Take by mouth.   tadalafil (CIALIS) 5 MG tablet Take 1 tablet (5 mg total) by mouth daily as needed. Take 1 tablet ( 5 mg total) by mouth daily    traZODone (DESYREL) 50 MG tablet TAKE 1/2-1 TABLETS (25-50 MG TOTAL) BY MOUTH AT BEDTIME.   trospium (SANCTURA) 20 MG tablet TAKE 1 TABLET BY MOUTH EVERYDAY AT BEDTIME   valACYclovir (VALTREX) 1000 MG tablet Take 1 tablet (1,000 mg total) by mouth 2 (two) times daily.       05/28/2022   11:09 AM 05/19/2022    8:01 AM 05/12/2022   10:54 AM 03/02/2022    8:32 AM  GAD 7 : Generalized Anxiety Score  Nervous, Anxious, on Edge '1 1 1 1  '$ Control/stop worrying '1 1 1 1  '$ Worry too much - different things '1 1 1 1  '$ Trouble relaxing 0 0 0 0  Restless '2 1 1 1  '$ Easily annoyed or irritable '1 1 1 1  '$ Afraid - awful might happen '1 1 1 '$ 0  Total GAD 7 Score '7 6 6 5  '$ Anxiety Difficulty Somewhat difficult Somewhat difficult Somewhat difficult Somewhat difficult       05/28/2022   11:09 AM 05/19/2022    8:01 AM 05/12/2022   10:53 AM  Depression screen PHQ 2/9  Decreased Interest '1 1 1  '$ Down, Depressed, Hopeless '1 1 1  '$ PHQ - 2 Score '2 2 2  '$ Altered sleeping 2 0 0  Tired, decreased energy '1 1 1  '$ Change in appetite 1 0 0  Feeling bad or failure about yourself  1 0 0  Trouble concentrating '1 2 2  '$ Moving slowly or fidgety/restless 1 0 0  Suicidal thoughts 0 0 0  PHQ-9 Score '9 5 5  '$ Difficult doing work/chores Somewhat difficult Not difficult at all Not difficult at all    BP Readings from Last 3 Encounters:  05/28/22 112/60  05/21/22 126/80  05/19/22 128/64    Physical Exam Vitals and nursing note reviewed.  Constitutional:      General: He is not in acute distress.    Appearance: He is well-developed.  HENT:     Head: Normocephalic and atraumatic.  Cardiovascular:     Rate and Rhythm: Normal rate and regular rhythm.  Pulmonary:     Effort: Pulmonary effort is normal. No respiratory distress.     Breath sounds: No wheezing or rhonchi.  Skin:    General: Skin is warm and dry.     Findings: Rash present.     Comments: Small red macules with some scaling on ear lobe and left temple.  Punctate  red papules on right arm - likely unrelated.  Neurological:     Mental Status: He is alert and oriented to person, place, and time.  Psychiatric:  Mood and Affect: Mood normal.        Behavior: Behavior normal.     Wt Readings from Last 3 Encounters:  05/28/22 151 lb (68.5 kg)  05/21/22 152 lb (68.9 kg)  05/19/22 151 lb (68.5 kg)    BP 112/60   Pulse 96   Ht '5\' 4"'$  (1.626 m)   Wt 151 lb (68.5 kg)   SpO2 96%   BMI 25.92 kg/m   Assessment and Plan:  Problem List Items Addressed This Visit       Musculoskeletal and Integument   Seborrheic dermatitis - Primary    Responded well to steroid taper - completed 2 days ago Recommend using Desonide bid to active lesions to avoid flare-up Keep Dermatology appointment for ongoing management       No follow-ups on file.   Partially dictated using [place], any errors are not intentional.  Glean Hess, MD [place], [place]

## 2022-06-19 ENCOUNTER — Ambulatory Visit: Payer: BC Managed Care – PPO | Admitting: Internal Medicine

## 2022-08-19 ENCOUNTER — Telehealth: Payer: Self-pay

## 2022-08-19 NOTE — Telephone Encounter (Signed)
Message left for the patient to call back with their decision.  The patient is due for their Colonoscopy. Their last colonoscopy was done 09/22/17 by Dr Byrnett. Dr Byrnett is no longer at our office and non of our providers do this service. We would like to know if you would like for us to send a referral to [facility] or to [facility] to have them contact you to get this scheduled.   

## 2022-08-26 ENCOUNTER — Encounter: Payer: Self-pay | Admitting: Physician Assistant

## 2022-08-26 ENCOUNTER — Ambulatory Visit: Payer: BC Managed Care – PPO | Admitting: Physician Assistant

## 2022-08-26 VITALS — BP 124/82 | HR 84 | Ht 64.0 in | Wt 155.0 lb

## 2022-08-26 DIAGNOSIS — R21 Rash and other nonspecific skin eruption: Secondary | ICD-10-CM

## 2022-08-26 NOTE — Progress Notes (Signed)
Date:  08/26/2022   Name:  Adam Ryan   DOB:  Feb 19, 1961   MRN:  [id number]   Chief Complaint: Rash  Rash This is a recurrent problem. Episode onset: X3-4 days. The problem has been gradually worsening since onset. The affected locations include the left arm and left lower leg. The rash is characterized by redness, itchiness, pain, burning, blistering and draining. Pertinent negatives include no fatigue or fever. Past treatments include anti-itch cream. The treatment provided mild relief. His past medical history is significant for eczema.   Adam Ryan presents today for evaluation of pruritic but painless rash on the left forearm since about 4 days ago, slightly improved over the last 24 hours.  No known exposure to sources of contact dermatitis, though he does have multiple outdoor pets.  Initially the rash had small vesicles per his report, but they have spontaneously drained.  He has been applying topical clobetasol cream without much improvement, also taking hydroxyzine at night for itching.  Recently seen by his dermatologist about 1 month ago for his chronic skin conditions including seborrheic dermatitis, eczema, rosacea, and resolving contact dermatitis of the left lower leg.  Adam Ryan asks for a another course of oral prednisone, noting that it usually takes 2 rounds to resolve his skin flares.   Medication list has been reviewed and updated.  Current Meds  Medication Sig   atorvastatin (LIPITOR) 20 MG tablet Take 1 tablet (20 mg total) by mouth daily.   Cholecalciferol (VITAMIN D3 PO) Take by mouth.   clobetasol cream (TEMOVATE) 0.05 % Apply 1 Application topically 2 (two) times daily.   desonide (DESOWEN) 0.05 % cream Apply topically 2 (two) times daily.   hydrOXYzine (ATARAX) 25 MG tablet Take 25 mg by mouth daily.   meloxicam (MOBIC) 15 MG tablet Take 15 mg by mouth daily.   Multiple Vitamin (MULTIVITAMIN) capsule Take 1 capsule by mouth daily.   Omega-3 Fatty Acids (FISH OIL PO)  Take by mouth.   tadalafil (CIALIS) 5 MG tablet Take 1 tablet (5 mg total) by mouth daily as needed. Take 1 tablet ( 5 mg total) by mouth daily   traZODone (DESYREL) 50 MG tablet TAKE 1/2-1 TABLETS (25-50 MG TOTAL) BY MOUTH AT BEDTIME.   triamcinolone cream (KENALOG) 0.1 % Apply 1 Application topically 2 (two) times daily.   trospium (SANCTURA) 20 MG tablet TAKE 1 TABLET BY MOUTH EVERYDAY AT BEDTIME   valACYclovir (VALTREX) 1000 MG tablet Take 1 tablet (1,000 mg total) by mouth 2 (two) times daily.     Review of Systems  Constitutional:  Negative for fatigue and fever.  Skin:  Positive for rash.    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   Rosacea 04/19/2017   Overweight (BMI 25.0-29.9) 04/19/2017   Mixed hyperlipidemia 11/26/2015   ADD (attention deficit disorder) 02/25/2015   Personal history of colonic polyps 09/07/2012   Depression 03/16/1998   Anxiety 03/16/1993    Allergies  Allergen Reactions   Metrocream [Metronidazole] Rash    Immunization History  Administered Date(s) Administered   Influenza Inj Mdck Quad Pf 01/28/2017, 01/29/2022   Influenza,inj,Quad PF,6+ Mos 12/23/2017, 11/22/2018   Influenza-Unspecified 10/30/2014, 12/12/2020   PFIZER(Purple Top)SARS-COV-2 Vaccination 06/23/2019, 07/18/2019, 01/24/2020   Tdap 10/15/2015   Zoster Recombinat (Shingrix) 04/20/2018, 07/20/2018  Past Surgical History:  Procedure Laterality Date   COLONOSCOPY     COLONOSCOPY WITH PROPOFOL N/A 09/22/2017   Procedure: COLONOSCOPY WITH PROPOFOL;  Surgeon: Earline Mayotte, MD;  Location: [facility];  Service: Endoscopy;  Laterality: N/A;   OPEN REDUCTION INTERNAL FIXATION (ORIF) HAND Left    TRIGGER FINGER RELEASE  01/13/2019   left ring finger   WISDOM TOOTH EXTRACTION   1981    Social History   Tobacco Use   Smoking status: Former    Types: Cigars    Quit date: 04/17/2015    Years since quitting: 7.3   Smokeless tobacco: Never  Vaping Use   Vaping Use: Former   Quit date: 07/14/2017  Substance Use Topics   Alcohol use: Yes    Alcohol/week: 2.0 standard drinks of alcohol    Types: 2 Cans of beer per week    Comment: 2 per week   Drug use: Yes    Types: Marijuana    Comment: once a week    Family History  Problem Relation Age of Onset   Parkinson's disease Mother    Heart disease Mother        open heart surgery, staph infection, died 3 months after surg   Cancer Father        bladder   Asthma Father    Diabetes Father    Hypertension Father    Aneurysm Father    Heart disease Paternal Grandfather         08/26/2022    1:41 PM 05/28/2022   11:09 AM 05/19/2022    8:01 AM 05/12/2022   10:54 AM  GAD 7 : Generalized Anxiety Score  Nervous, Anxious, on Edge 0 1 1 1   Control/stop worrying 0 1 1 1   Worry too much - different things 0 1 1 1   Trouble relaxing 0 0 0 0  Restless 2 2 1 1   Easily annoyed or irritable 0 1 1 1   Afraid - awful might happen 0 1 1 1   Total GAD 7 Score 2 7 6 6   Anxiety Difficulty Not difficult at all Somewhat difficult Somewhat difficult Somewhat difficult       08/26/2022    1:41 PM 05/28/2022   11:09 AM 05/19/2022    8:01 AM  Depression screen PHQ 2/9  Decreased Interest 0 1 1  Down, Depressed, Hopeless 0 1 1  PHQ - 2 Score 0 2 2  Altered sleeping 0 2 0  Tired, decreased energy 1 1 1   Change in appetite 0 1 0  Feeling bad or failure about yourself  0 1 0  Trouble concentrating 1 1 2   Moving slowly or fidgety/restless 0 1 0  Suicidal thoughts 0 0 0  PHQ-9 Score 2 9 5   Difficult doing work/chores Not difficult at all Somewhat difficult Not difficult at all    BP Readings from Last 3 Encounters:  08/26/22 124/82  05/28/22 112/60  05/21/22 126/80    Wt Readings from Last 3 Encounters:  08/26/22 155 lb  (70.3 kg)  05/28/22 151 lb (68.5 kg)  05/21/22 152 lb (68.9 kg)    BP 124/82   Pulse 84   Ht 5\' 4"  (1.626 m)   Wt 155 lb (70.3 kg)   SpO2 97%   BMI 26.61 kg/m   Physical Exam Vitals and nursing note reviewed.  Constitutional:      Appearance: Normal appearance.  Cardiovascular:     Rate and Rhythm: Normal rate.  Pulmonary:  Effort: Pulmonary effort is normal.  Abdominal:     General: There is no distension.  Musculoskeletal:        General: Normal range of motion.  Skin:    General: Skin is warm and dry.     Comments: 2 cm area of erythema on the left mid forearm with resolving 1-2 mm vesicles, nontender and without discharge (see image). Pink macular rash of left shin which seems to be resolving, but slightly dry.  Neurological:     Mental Status: He is alert and oriented to person, place, and time.     Gait: Gait is intact.  Psychiatric:        Mood and Affect: Mood and affect normal.     Recent Labs     Component Value Date/Time   NA 139 03/02/2022 0927   K 4.9 03/02/2022 0927   CL 101 03/02/2022 0927   CO2 26 03/02/2022 0927   GLUCOSE 106 (H) 03/02/2022 0927   GLUCOSE 85 04/24/2019 1457   BUN 21 03/02/2022 0927   CREATININE 0.98 03/02/2022 0927   CREATININE 0.93 04/24/2019 1457   CALCIUM 9.3 03/02/2022 0927   PROT 6.8 03/02/2022 0927   ALBUMIN 4.6 03/02/2022 0927   AST 25 03/02/2022 0927   ALT 24 03/02/2022 0927   ALKPHOS 100 03/02/2022 0927   BILITOT 0.5 03/02/2022 0927   GFRNONAA 91 04/24/2019 1457   GFRAA 105 04/24/2019 1457    Lab Results  Component Value Date   WBC 4.2 03/02/2022   HGB 15.8 03/02/2022   HCT 45.3 03/02/2022   MCV 89 03/02/2022   PLT 286 03/02/2022   Lab Results  Component Value Date   HGBA1C 5.5 02/26/2021   Lab Results  Component Value Date   CHOL 179 03/02/2022   HDL 75 03/02/2022   LDLCALC 94 03/02/2022   TRIG 50 03/02/2022   CHOLHDL 2.4 03/02/2022   Lab Results  Component Value Date   TSH 1.11 04/24/2019      Assessment and Plan:  1. Rash Discussed with patient I do not think it would be prudent to use another course of oral steroid for such a small area of involvement, especially if improving over the last 24 hours.  Discussed risk of worsening chronic skin conditions with repeated courses of prednisone.  He has multiple topical steroid preparations including clobetasol, triamcinolone, desonide.  Recommended he try the triamcinolone cream next to see if this more quickly resolves the rash.     Return if symptoms worsen or fail to improve.   Partially dictated using Animal nutritionist. Any errors are unintentional.  Alvester Morin, PA-C, DMSc, Nutritionist [facility] [phone]

## 2022-10-06 ENCOUNTER — Other Ambulatory Visit: Payer: BC Managed Care – PPO

## 2022-10-06 DIAGNOSIS — N401 Enlarged prostate with lower urinary tract symptoms: Secondary | ICD-10-CM

## 2022-10-06 LAB — URINALYSIS, COMPLETE
Bilirubin, UA: NEGATIVE
Glucose, UA: NEGATIVE
Ketones, UA: NEGATIVE
Leukocytes,UA: NEGATIVE
Nitrite, UA: NEGATIVE
Protein,UA: NEGATIVE
RBC, UA: NEGATIVE
Specific Gravity, UA: 1.025 (ref 1.005–1.030)
Urobilinogen, Ur: 0.2 mg/dL (ref 0.2–1.0)
pH, UA: 5.5 (ref 5.0–7.5)

## 2022-10-06 LAB — MICROSCOPIC EXAMINATION

## 2022-10-07 LAB — PSA: Prostate Specific Ag, Serum: 1.2 ng/mL (ref 0.0–4.0)

## 2022-10-12 NOTE — Progress Notes (Unsigned)
10/13/22 9:08 AM   Adam Ryan March 10, 1961 [id number]  Referring provider:  Reubin Milan, MD [address] [place],  [place] [address]  Chief Complaint  Patient presents with   Benign Prostatic Hypertrophy    Urological history  1. BPH with LU TS -PSA (09/2022) 1.2  -cysto 10/2018 NED   2. Pelvic floor dysfunction -pelvic floor exercises  -trospium IR 20 mg qhs   3. Family history of bladder cancer -father with bladder cancer and smoker history   HPI: Adam Ryan is a 62 y.o.male who presents today for yearly follow up .    I PSS 9/2 PVR 0 mL  UA specific gravity 1.025, pH 5.5, color yellow clear, 0-5 WBCs, 0-2 RBCs, 0-10 epithelial cells, calcium oxalate crystals present, mucus present and few bacteria.   He has no urinary complaints at this time.  Patient denies any modifying or aggravating factors.  Patient denies any recent UTI's, gross hematuria, dysuria or suprapubic/flank pain.  Patient denies any fevers, chills, nausea or vomiting.   He is taking his Sanctura IR 20 mg nightly and is tadalafil 5 mg daily and is at goal with his urinary symptoms.   IPSS     Row Name 10/13/22 0800         International Prostate Symptom Score   How often have you had the sensation of not emptying your bladder? Less than 1 in 5     How often have you had to urinate less than every two hours? Less than 1 in 5 times     How often have you found you stopped and started again several times when you urinated? Less than 1 in 5 times     How often have you found it difficult to postpone urination? Less than 1 in 5 times     How often have you had a weak urinary stream? Less than half the time     How often have you had to strain to start urination? Less than 1 in 5 times     How many times did you typically get up at night to urinate? 2 Times     Total IPSS Score 9       Quality of Life due to urinary symptoms   If you were to spend the rest of your life with  your urinary condition just the way it is now how would you feel about that? Mostly Satisfied             Score:  1-7 Mild 8-19 Moderate 20-35 Severe   SHIM 17  He has an occasional spontaneous erection.  He denies any curvature or pain with erections.  He is taking tadalafil 5 mg daily.  He is at goal with his ED.   SHIM     Row Name 10/13/22 [phone]         SHIM: Over the last 6 months:   How do you rate your confidence that you could get and keep an erection? High     When you had erections with sexual stimulation, how often were your erections hard enough for penetration (entering your partner)? Most Times (much more than half the time)     During sexual intercourse, how often were you able to maintain your erection after you had penetrated (entered) your partner? Most Times (much more than half the time)     During sexual intercourse, how difficult was it to maintain your erection to completion of intercourse?  Not Difficult     When you attempted sexual intercourse, how often was it satisfactory for you? Most Times (much more than half the time)       SHIM Total Score   SHIM 21              SHIM     Row Name 10/13/22 0851         SHIM: Over the last 6 months:   How do you rate your confidence that you could get and keep an erection? High     When you had erections with sexual stimulation, how often were your erections hard enough for penetration (entering your partner)? Most Times (much more than half the time)     During sexual intercourse, how often were you able to maintain your erection after you had penetrated (entered) your partner? Most Times (much more than half the time)     During sexual intercourse, how difficult was it to maintain your erection to completion of intercourse? Not Difficult     When you attempted sexual intercourse, how often was it satisfactory for you? Most Times (much more than half the time)       SHIM Total Score   SHIM 21               Score: 1-7 Severe ED 8-11 Moderate ED 12-16 Mild-Moderate ED 17-21 Mild ED 22-25 No ED   PMH: Past Medical History:  Diagnosis Date   ADD (attention deficit disorder)    Anxiety    Adam polyps 2019   Depression    Erectile dysfunction    Fever blister 02/28/2016   Hyperlipidemia    Rosacea 04/19/2017   Trigger ring finger of left hand 04/20/2018    Surgical History: Past Surgical History:  Procedure Laterality Date   COLONOSCOPY     COLONOSCOPY WITH PROPOFOL N/A 09/22/2017   Procedure: COLONOSCOPY WITH PROPOFOL;  Surgeon: Earline Mayotte, MD;  Location: [facility] ENDOSCOPY;  Service: Endoscopy;  Laterality: N/A;   OPEN REDUCTION INTERNAL FIXATION (ORIF) HAND Left    TRIGGER FINGER RELEASE  01/13/2019   left ring finger   WISDOM TOOTH EXTRACTION  1981    Home Medications:  Allergies as of 10/13/2022       Reactions   Metrocream [metronidazole] Rash        Medication List        Accurate as of October 13, 2022  9:08 AM. If you have any questions, ask your nurse or doctor.          atorvastatin 20 MG tablet Commonly known as: LIPITOR Take 1 tablet (20 mg total) by mouth daily.   clobetasol cream 0.05 % Commonly known as: TEMOVATE Apply 1 Application topically 2 (two) times daily.   desonide 0.05 % cream Commonly known as: DesOwen Apply topically 2 (two) times daily.   FISH OIL PO Take by mouth.   hydrOXYzine 25 MG tablet Commonly known as: ATARAX Take 25 mg by mouth daily.   meloxicam 15 MG tablet Commonly known as: MOBIC Take 15 mg by mouth daily.   multivitamin capsule Take 1 capsule by mouth daily.   tadalafil 5 MG tablet Commonly known as: CIALIS Take 1 tablet (5 mg total) by mouth daily as needed. Take 1 tablet ( 5 mg total) by mouth daily   traZODone 50 MG tablet Commonly known as: DESYREL TAKE 1/2-1 TABLETS (25-50 MG TOTAL) BY MOUTH AT BEDTIME.   triamcinolone cream 0.1 % Commonly known as:  KENALOG Apply 1 Application  topically 2 (two) times daily.   trospium 20 MG tablet Commonly known as: SANCTURA TAKE 1 TABLET BY MOUTH EVERYDAY AT BEDTIME   valACYclovir 1000 MG tablet Commonly known as: VALTREX Take 1 tablet (1,000 mg total) by mouth 2 (two) times daily.   VITAMIN D3 PO Take by mouth.        Allergies:  Allergies  Allergen Reactions   Metrocream [Metronidazole] Rash    Family History: Family History  Problem Relation Age of Onset   Parkinson's disease Mother    Heart disease Mother        open heart surgery, staph infection, died 3 months after surg   Cancer Father        bladder   Asthma Father    Diabetes Father    Hypertension Father    Aneurysm Father    Heart disease Paternal Grandfather     Social History:  reports that he quit smoking about 7 years ago. His smoking use included cigars. He has never used smokeless tobacco. He reports current alcohol use of about 2.0 standard drinks of alcohol per week. He reports current drug use. Drug: Marijuana.   Physical Exam: BP 99/66   Pulse 65   Ht 5\' 4"  (1.626 m)   Wt 150 lb (68 kg)   BMI 25.75 kg/m   Constitutional:  Well nourished. Alert and oriented, No acute distress. HEENT: [place] AT, moist mucus membranes.  Trachea midline Cardiovascular: No clubbing, cyanosis, or edema. Respiratory: Normal respiratory effort, no increased work of breathing. GU: No CVA tenderness.  No bladder fullness or masses.  Patient with circumcised phallus.  Urethral meatus is patent.  No penile discharge. No penile lesions or rashes. Scrotum without lesions, cysts, rashes and/or edema.  Testicles are located scrotally bilaterally. No masses are appreciated in the testicles. Left and right epididymis are normal. Rectal: Patient with  normal sphincter tone. Anus and perineum without scarring or rashes. No rectal masses are appreciated. Prostate is approximately 45 grams, no nodules are appreciated. Seminal vesicles could not be palpated.  Neurologic:  Grossly intact, no focal deficits, moving all 4 extremities. Psychiatric: Normal mood and affect.   Laboratory Data:  Component     Latest Ref Rng 10/06/2022 Prostate Specific Ag, Serum     0.0 - 4.0 ng/mL 1.2    Component     Latest Ref Rng 10/06/2022 Glucose, UA     Negative  Negative  Specific Gravity, UA     1.005 - 1.030  1.025  pH, UA     5.0 - 7.5  5.5  Color, UA     Yellow  Yellow  Appearance Ur     Clear  Clear  Leukocytes,UA     Negative  Negative  Protein,UA     Negative/Trace  Negative  Ketones, UA     Negative  Negative  RBC, UA     Negative  Negative  Bilirubin, UA     Negative  Negative  Urobilinogen, Ur     0.2 - 1.0 mg/dL 0.2  Nitrite, UA     Negative  Negative  Microscopic Examination See below:    Component     Latest Ref Rng 10/06/2022 WBC, UA     0 - 5 /hpf 0-5  Bacteria, UA     None seen/Few  Few  RBC, Urine     0 - 2 /hpf 0-2  Epithelial Cells (non renal)     0 - 10 /  hpf 0-10  Crystals     N/A  Present !  Crystal Type     N/A  Calcium Oxalate  Mucus, UA     Not Estab.  Present !    Legend: ! Abnormal I have reviewed the labs.   Pertinent Imaging: Results for orders placed or performed in visit on 10/13/22  Bladder Scan (Post Void Residual) in office  Result Value Ref Range   Scan Result 0      Assessment & Plan:    1. Pelvic floor dysfunction -At goal -Bladder Scan (Post Void Residual) in office demonstrates adequate emptying -Continue pelvic floor exercises at home - continue trospium IR at bedtime   2. BPH with obstruction/lower urinary tract symptoms -At goal -Continue tadalafil 5 mg daily  3. Prostate cancer screening  - PSA low and stable  - DRE benign   Return in about 1 year (around 10/13/2023) for IPSS, SHIM, PSA and exam.  Michiel Cowboy, PA-C   [facility] [address] [place], [place] [address] [phone]

## 2022-10-13 ENCOUNTER — Ambulatory Visit: Payer: BC Managed Care – PPO | Admitting: Urology

## 2022-10-13 ENCOUNTER — Encounter: Payer: Self-pay | Admitting: Urology

## 2022-10-13 VITALS — BP 99/66 | HR 65 | Ht 64.0 in | Wt 150.0 lb

## 2022-10-13 DIAGNOSIS — N5314 Retrograde ejaculation: Secondary | ICD-10-CM | POA: Diagnosis not present

## 2022-10-13 DIAGNOSIS — M6289 Other specified disorders of muscle: Secondary | ICD-10-CM

## 2022-10-13 DIAGNOSIS — N138 Other obstructive and reflux uropathy: Secondary | ICD-10-CM

## 2022-10-13 DIAGNOSIS — Z125 Encounter for screening for malignant neoplasm of prostate: Secondary | ICD-10-CM | POA: Diagnosis not present

## 2022-10-13 DIAGNOSIS — N401 Enlarged prostate with lower urinary tract symptoms: Secondary | ICD-10-CM | POA: Diagnosis not present

## 2022-10-13 LAB — BLADDER SCAN AMB NON-IMAGING: Scan Result: 0

## 2022-10-13 MED ORDER — TROSPIUM CHLORIDE 20 MG PO TABS: ORAL_TABLET | ORAL | 3 refills | Status: DC

## 2022-10-13 MED ORDER — TADALAFIL 5 MG PO TABS: 5.0000 mg | ORAL_TABLET | Freq: Every day | ORAL | 3 refills | Status: DC | PRN

## 2023-03-05 ENCOUNTER — Encounter: Payer: Self-pay | Admitting: Internal Medicine

## 2023-03-05 ENCOUNTER — Ambulatory Visit (INDEPENDENT_AMBULATORY_CARE_PROVIDER_SITE_OTHER): Payer: BC Managed Care – PPO | Admitting: Internal Medicine

## 2023-03-05 ENCOUNTER — Telehealth: Payer: Self-pay

## 2023-03-05 VITALS — BP 110/76 | HR 76 | Ht 64.0 in | Wt 153.0 lb

## 2023-03-05 DIAGNOSIS — F320 Major depressive disorder, single episode, mild: Secondary | ICD-10-CM | POA: Insufficient documentation

## 2023-03-05 DIAGNOSIS — E782 Mixed hyperlipidemia: Secondary | ICD-10-CM

## 2023-03-05 DIAGNOSIS — Z860100 Personal history of colon polyps, unspecified: Secondary | ICD-10-CM

## 2023-03-05 DIAGNOSIS — Z131 Encounter for screening for diabetes mellitus: Secondary | ICD-10-CM

## 2023-03-05 DIAGNOSIS — Z125 Encounter for screening for malignant neoplasm of prostate: Secondary | ICD-10-CM

## 2023-03-05 DIAGNOSIS — F5101 Primary insomnia: Secondary | ICD-10-CM

## 2023-03-05 DIAGNOSIS — Z0000 Encounter for general adult medical examination without abnormal findings: Secondary | ICD-10-CM

## 2023-03-05 MED ORDER — ATORVASTATIN CALCIUM 20 MG PO TABS: 20.0000 mg | ORAL_TABLET | Freq: Every day | ORAL | 3 refills | Status: DC

## 2023-03-05 MED ORDER — TRAZODONE HCL 50 MG PO TABS: ORAL_TABLET | ORAL | 3 refills | Status: DC

## 2023-03-05 MED ORDER — HYDROXYZINE HCL 25 MG PO TABS: 25.0000 mg | ORAL_TABLET | Freq: Every evening | ORAL | 1 refills | Status: DC | PRN

## 2023-03-05 NOTE — Progress Notes (Signed)
Date:  03/05/2023   Name:  Adam Ryan   DOB:  1960/12/25   MRN:  [id number]   Chief Complaint: Annual Exam Adam Ryan is a 62 y.o. male who presents today for his Complete Annual Exam. He feels well. He reports exercising walking daily. He reports he is sleeping well.   Colonoscopy: 09/2017 repeat 5 yrs  Immunization History  Administered Date(s) Administered   Influenza Inj Mdck Quad Pf 01/28/2017, 01/29/2022   Influenza, Mdck, Trivalent,PF 6+ MOS(egg free) 02/02/2023   Influenza,inj,Quad PF,6+ Mos 12/23/2017, 11/22/2018   Influenza-Unspecified 10/30/2014, 12/12/2020   PFIZER(Purple Top)SARS-COV-2 Vaccination 06/23/2019, 07/18/2019, 01/24/2020   Tdap 10/15/2015   Zoster Recombinant(Shingrix) 04/20/2018, 07/20/2018   Health Maintenance Due  Topic Date Due   Colonoscopy  09/23/2022    Lab Results  Component Value Date   PSA1 1.2 10/06/2022   PSA1 1.2 09/30/2021   PSA1 1.0 02/26/2021   PSA 1.2 04/20/2018   PSA 1.4 04/19/2017    Hyperlipidemia This is a chronic problem. The problem is controlled. Pertinent negatives include no chest pain or shortness of breath. Current antihyperlipidemic treatment includes statins.  Insomnia Primary symptoms: difficulty falling asleep, frequent awakening.   The problem occurs nightly.    Review of Systems  Constitutional:  Negative for fatigue and unexpected weight change.  HENT:  Negative for nosebleeds.   Eyes:  Negative for visual disturbance.  Respiratory:  Negative for cough, chest tightness, shortness of breath and wheezing.   Cardiovascular:  Negative for chest pain, palpitations and leg swelling.  Gastrointestinal:  Negative for abdominal pain, constipation and diarrhea.  Musculoskeletal:  Positive for arthralgias (left shoulder arthritis).  Neurological:  Negative for dizziness, weakness, light-headedness and headaches.  Psychiatric/Behavioral:  The patient has insomnia.      Lab Results  Component Value Date    NA 139 03/02/2022   K 4.9 03/02/2022   CO2 26 03/02/2022   GLUCOSE 106 (H) 03/02/2022   BUN 21 03/02/2022   CREATININE 0.98 03/02/2022   CALCIUM 9.3 03/02/2022   EGFR 88 03/02/2022   GFRNONAA 91 04/24/2019   Lab Results  Component Value Date   CHOL 179 03/02/2022   HDL 75 03/02/2022   LDLCALC 94 03/02/2022   TRIG 50 03/02/2022   CHOLHDL 2.4 03/02/2022   Lab Results  Component Value Date   TSH 1.11 04/24/2019   Lab Results  Component Value Date   HGBA1C 5.5 02/26/2021   Lab Results  Component Value Date   WBC 4.2 03/02/2022   HGB 15.8 03/02/2022   HCT 45.3 03/02/2022   MCV 89 03/02/2022   PLT 286 03/02/2022   Lab Results  Component Value Date   ALT 24 03/02/2022   AST 25 03/02/2022   ALKPHOS 100 03/02/2022   BILITOT 0.5 03/02/2022   No results found for: "25OHVITD2", "25OHVITD3", "VD25OH"   Patient Active Problem List   Diagnosis Date Noted   Current mild episode of major depressive disorder, unspecified whether recurrent (HCC) 03/05/2023   Seborrheic dermatitis 05/21/2022   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/2021   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   Rosacea 04/19/2017   Overweight (BMI 25.0-29.9) 04/19/2017   Mixed hyperlipidemia 11/26/2015   ADD (attention deficit disorder) 02/25/2015   History of colonic polyps 09/07/2012   Depression 03/16/1998   Anxiety 03/16/1993    Allergies  Allergen Reactions   Metrocream [Metronidazole] Rash    Past Surgical History:  Procedure Laterality Date   COLONOSCOPY  COLONOSCOPY WITH PROPOFOL N/A 09/22/2017   Procedure: COLONOSCOPY WITH PROPOFOL;  Surgeon: Earline Mayotte, MD;  Location: [facility] ENDOSCOPY;  Service: Endoscopy;  Laterality: N/A;   OPEN REDUCTION INTERNAL FIXATION (ORIF) HAND Left    TRIGGER FINGER RELEASE  01/13/2019   left ring finger   WISDOM TOOTH EXTRACTION  1981    Social History   Tobacco Use   Smoking status: Former    Types: Cigars    Quit date:  04/17/2015    Years since quitting: 7.8   Smokeless tobacco: Never  Vaping Use   Vaping status: Former   Quit date: 07/14/2017  Substance Use Topics   Alcohol use: Yes    Alcohol/week: 2.0 standard drinks of alcohol    Types: 2 Cans of beer per week    Comment: 2 per week   Drug use: Yes    Types: Marijuana    Comment: once a week     Medication list has been reviewed and updated.  Current Meds  Medication Sig   Cholecalciferol (VITAMIN D3 PO) Take by mouth.   clobetasol cream (TEMOVATE) 0.05 % Apply 1 Application topically 2 (two) times daily.   desonide (DESOWEN) 0.05 % cream Apply topically 2 (two) times daily.   Multiple Vitamin (MULTIVITAMIN) capsule Take 1 capsule by mouth daily.   tadalafil (CIALIS) 5 MG tablet Take 1 tablet (5 mg total) by mouth daily as needed. Take 1 tablet ( 5 mg total) by mouth daily   triamcinolone cream (KENALOG) 0.1 % Apply 1 Application topically 2 (two) times daily.   trospium (SANCTURA) 20 MG tablet TAKE 1 TABLET BY MOUTH EVERYDAY AT BEDTIME   valACYclovir (VALTREX) 1000 MG tablet Take 1 tablet (1,000 mg total) by mouth 2 (two) times daily.   [DISCONTINUED] atorvastatin (LIPITOR) 20 MG tablet Take 1 tablet (20 mg total) by mouth daily.   [DISCONTINUED] hydrOXYzine (ATARAX) 25 MG tablet Take 25 mg by mouth daily.   [DISCONTINUED] Omega-3 Fatty Acids (FISH OIL PO) Take by mouth.   [DISCONTINUED] traZODone (DESYREL) 50 MG tablet TAKE 1/2-1 TABLETS (25-50 MG TOTAL) BY MOUTH AT BEDTIME.       03/05/2023    8:04 AM 08/26/2022    1:41 PM 05/28/2022   11:09 AM 05/19/2022    8:01 AM  GAD 7 : Generalized Anxiety Score  Nervous, Anxious, on Edge 1 0 1 1  Control/stop worrying 1 0 1 1  Worry too much - different things 1 0 1 1  Trouble relaxing 0 0 0 0  Restless 2 2 2 1   Easily annoyed or irritable 1 0 1 1  Afraid - awful might happen 0 0 1 1  Total GAD 7 Score 6 2 7 6   Anxiety Difficulty Not difficult at all Not difficult at all Somewhat difficult  Somewhat difficult       03/05/2023    8:03 AM 08/26/2022    1:41 PM 05/28/2022   11:09 AM  Depression screen PHQ 2/9  Decreased Interest 1 0 1  Down, Depressed, Hopeless 0 0 1  PHQ - 2 Score 1 0 2  Altered sleeping 0 0 2  Tired, decreased energy 1 1 1   Change in appetite 0 0 1  Feeling bad or failure about yourself  0 0 1  Trouble concentrating 3 1 1   Moving slowly or fidgety/restless 0 0 1  Suicidal thoughts 0 0 0  PHQ-9 Score 5 2 9   Difficult doing work/chores Not difficult at all Not difficult at  all Somewhat difficult    BP Readings from Last 3 Encounters:  03/05/23 110/76  10/13/22 99/66  08/26/22 124/82    Physical Exam Vitals and nursing note reviewed.  Constitutional:      Appearance: Normal appearance. He is well-developed.  HENT:     Head: Normocephalic.     Right Ear: Tympanic membrane, ear canal and external ear normal.     Left Ear: Tympanic membrane, ear canal and external ear normal.     Nose: Nose normal.  Eyes:     Conjunctiva/sclera: Conjunctivae normal.     Pupils: Pupils are equal, round, and reactive to light.  Neck:     Thyroid: No thyromegaly.     Vascular: No carotid bruit.  Cardiovascular:     Rate and Rhythm: Normal rate and regular rhythm.     Heart sounds: Normal heart sounds.  Pulmonary:     Effort: Pulmonary effort is normal.     Breath sounds: Normal breath sounds. No wheezing.  Chest:  Breasts:    Right: No mass.     Left: No mass.  Abdominal:     General: Bowel sounds are normal.     Palpations: Abdomen is soft.     Tenderness: There is no abdominal tenderness.  Musculoskeletal:     Left shoulder: Decreased range of motion.     Cervical back: Normal range of motion and neck supple.     Right lower leg: No edema.     Left lower leg: No edema.  Lymphadenopathy:     Cervical: No cervical adenopathy.  Skin:    General: Skin is warm and dry.  Neurological:     Mental Status: He is alert and oriented to person, place, and  time.     Deep Tendon Reflexes: Reflexes are normal and symmetric.  Psychiatric:        Attention and Perception: Attention normal.        Mood and Affect: Mood normal.     Wt Readings from Last 3 Encounters:  03/05/23 153 lb (69.4 kg)  10/13/22 150 lb (68 kg)  08/26/22 155 lb (70.3 kg)    BP 110/76 (BP Location: Left Arm, Patient Position: Sitting, Cuff Size: Normal)   Pulse 76   Ht 5\' 4"  (1.626 m)   Wt 153 lb (69.4 kg)   SpO2 99%   BMI 26.26 kg/m   Assessment and Plan:  Problem List Items Addressed This Visit       Unprioritized   History of colonic polyps   Relevant Orders   Ambulatory referral to Gastroenterology   Mixed hyperlipidemia (Chronic)   LDL is  Lab Results  Component Value Date   LDLCALC 94 03/02/2022   Current regimen is atorvastatin.  Tolerating medications well without issues.       Relevant Medications   atorvastatin (LIPITOR) 20 MG tablet   Other Relevant Orders   Lipid panel   Primary insomnia (Chronic)   Hx of anxiety and insomnia. Currently taking Trazodone with benefit.      Relevant Medications   traZODone (DESYREL) 50 MG tablet   Current mild episode of major depressive disorder, unspecified whether recurrent (HCC)   Relevant Medications   traZODone (DESYREL) 50 MG tablet   hydrOXYzine (ATARAX) 25 MG tablet   Other Visit Diagnoses       Annual physical exam    -  Primary   Relevant Orders   CBC with Differential/Platelet   Comprehensive metabolic panel   Hemoglobin A1c  Lipid panel     Prostate cancer screening         Screening for diabetes mellitus       Relevant Orders   Comprehensive metabolic panel   Hemoglobin A1c       Return in about 1 year (around 03/04/2024) for CPX.    Reubin Milan, MD [facility] [place]

## 2023-03-05 NOTE — Assessment & Plan Note (Signed)
LDL is  Lab Results  Component Value Date   LDLCALC 94 03/02/2022   Current regimen is atorvastatin.  Tolerating medications well without issues.

## 2023-03-05 NOTE — Assessment & Plan Note (Signed)
Hx of anxiety and insomnia. Currently taking Trazodone with benefit.

## 2023-03-05 NOTE — Telephone Encounter (Signed)
Gastroenterology Pre-Procedure Review  Request Date: TBD Requesting Physician: Dr. Jodelle Gross  PATIENT REVIEW QUESTIONS: The patient responded to the following health history questions as indicated:    1. Are you having any GI issues? no 2. Do you have a personal history of Polyps? yes (last colonoscopy performed by Dr. Lemar Livings 09/22/2017 recommended repeat in 5 years) 3. Do you have a family history of Colon Cancer or Polyps? no 4. Diabetes Mellitus? no 5. Joint replacements in the past 12 months?no 6. Major health problems in the past 3 months?no 7. Any artificial heart valves, MVP, or defibrillator?no    MEDICATIONS & ALLERGIES:    Patient reports the following regarding taking any anticoagulation/antiplatelet therapy:   Plavix, Coumadin, Eliquis, Xarelto, Lovenox, Pradaxa, Brilinta, or Effient? no Aspirin? no  Patient confirms/reports the following medications:  Current Outpatient Medications  Medication Sig Dispense Refill   atorvastatin (LIPITOR) 20 MG tablet Take 1 tablet (20 mg total) by mouth daily. 90 tablet 3   Cholecalciferol (VITAMIN D3 PO) Take by mouth.     clobetasol cream (TEMOVATE) 0.05 % Apply 1 Application topically 2 (two) times daily.     desonide (DESOWEN) 0.05 % cream Apply topically 2 (two) times daily. 30 g 0   hydrOXYzine (ATARAX) 25 MG tablet Take 1 tablet (25 mg total) by mouth at bedtime as needed. 90 tablet 1   meloxicam (MOBIC) 15 MG tablet Take 15 mg by mouth daily. (Patient not taking: Reported on 03/05/2023)     Multiple Vitamin (MULTIVITAMIN) capsule Take 1 capsule by mouth daily.     tadalafil (CIALIS) 5 MG tablet Take 1 tablet (5 mg total) by mouth daily as needed. Take 1 tablet ( 5 mg total) by mouth daily 90 tablet 3   traZODone (DESYREL) 50 MG tablet TAKE 1/2-1 TABLETS (25-50 MG TOTAL) BY MOUTH AT BEDTIME. 90 tablet 3   triamcinolone cream (KENALOG) 0.1 % Apply 1 Application topically 2 (two) times daily.     trospium (SANCTURA) 20 MG tablet TAKE 1  TABLET BY MOUTH EVERYDAY AT BEDTIME 90 tablet 3   valACYclovir (VALTREX) 1000 MG tablet Take 1 tablet (1,000 mg total) by mouth 2 (two) times daily. 30 tablet 0   No current facility-administered medications for this visit.    Patient confirms/reports the following allergies:  Allergies  Allergen Reactions   Metrocream [Metronidazole] Rash    No orders of the defined types were placed in this encounter.   AUTHORIZATION INFORMATION Primary Insurance: 1D#: Group #:  Secondary Insurance: 1D#: Group #:  SCHEDULE INFORMATION: Date: TBD Time: Location: [facility]

## 2023-03-06 LAB — CBC WITH DIFFERENTIAL/PLATELET
Basophils Absolute: 0.1 10*3/uL (ref 0.0–0.2)
Basos: 1 %
EOS (ABSOLUTE): 0.1 10*3/uL (ref 0.0–0.4)
Eos: 2 %
Hematocrit: 46.6 % (ref 37.5–51.0)
Hemoglobin: 15.6 g/dL (ref 13.0–17.7)
Immature Grans (Abs): 0 10*3/uL (ref 0.0–0.1)
Immature Granulocytes: 0 %
Lymphocytes Absolute: 1.4 10*3/uL (ref 0.7–3.1)
Lymphs: 30 %
MCH: 31.3 pg (ref 26.6–33.0)
MCHC: 33.5 g/dL (ref 31.5–35.7)
MCV: 93 fL (ref 79–97)
Monocytes Absolute: 0.4 10*3/uL (ref 0.1–0.9)
Monocytes: 8 %
Neutrophils Absolute: 2.7 10*3/uL (ref 1.4–7.0)
Neutrophils: 59 %
Platelets: 282 10*3/uL (ref 150–450)
RBC: 4.99 x10E6/uL (ref 4.14–5.80)
RDW: 12.2 % (ref 11.6–15.4)
WBC: 4.6 10*3/uL (ref 3.4–10.8)

## 2023-03-06 LAB — COMPREHENSIVE METABOLIC PANEL
ALT: 34 [IU]/L (ref 0–44)
AST: 27 [IU]/L (ref 0–40)
Albumin: 4.5 g/dL (ref 3.9–4.9)
Alkaline Phosphatase: 96 [IU]/L (ref 44–121)
BUN/Creatinine Ratio: 23 (ref 10–24)
BUN: 22 mg/dL (ref 8–27)
Bilirubin Total: 0.3 mg/dL (ref 0.0–1.2)
CO2: 24 mmol/L (ref 20–29)
Calcium: 9.3 mg/dL (ref 8.6–10.2)
Chloride: 100 mmol/L (ref 96–106)
Creatinine, Ser: 0.94 mg/dL (ref 0.76–1.27)
Globulin, Total: 2.5 g/dL (ref 1.5–4.5)
Glucose: 112 mg/dL — ABNORMAL HIGH (ref 70–99)
Potassium: 4.5 mmol/L (ref 3.5–5.2)
Sodium: 138 mmol/L (ref 134–144)
Total Protein: 7 g/dL (ref 6.0–8.5)
eGFR: 92 mL/min/{1.73_m2} (ref >59)

## 2023-03-06 LAB — HEMOGLOBIN A1C
Est. average glucose Bld gHb Est-mCnc: 114 mg/dL
Hgb A1c MFr Bld: 5.6 % (ref 4.8–5.6)

## 2023-03-06 LAB — LIPID PANEL
Chol/HDL Ratio: 2.6 {ratio} (ref 0.0–5.0)
Cholesterol, Total: 199 mg/dL (ref 100–199)
HDL: 77 mg/dL (ref >39)
LDL Chol Calc (NIH): 111 mg/dL — ABNORMAL HIGH (ref 0–99)
Triglycerides: 61 mg/dL (ref 0–149)
VLDL Cholesterol Cal: 11 mg/dL (ref 5–40)

## 2023-03-22 NOTE — Telephone Encounter (Signed)
 Referral letter sent to patient.  Thanks, [place], [place]

## 2023-03-29 ENCOUNTER — Other Ambulatory Visit: Payer: Self-pay

## 2023-03-29 DIAGNOSIS — Z860100 Personal history of colon polyps, unspecified: Secondary | ICD-10-CM

## 2023-03-29 MED ORDER — NA SULFATE-K SULFATE-MG SULF 17.5-3.13-1.6 GM/177ML PO SOLN: 1.0000 | Freq: Once | ORAL | 0 refills | Status: AC

## 2023-03-29 NOTE — Addendum Note (Signed)
 Addended by: Avie Arenas on: 03/29/2023 03:29 PM   Modules accepted: Orders

## 2023-03-29 NOTE — Telephone Encounter (Signed)
Patient called in to schedule colonoscopy.

## 2023-03-29 NOTE — Telephone Encounter (Signed)
 Call returned colonoscopy scheduled 05/21/23 at [facility] with Dr. Allegra Lai.  Triage complted 03/05/23.  Pt to email me new insurance card.  Thanks,  [place], [place]

## 2023-04-07 NOTE — Telephone Encounter (Signed)
Patient called in to reschedule his procedure

## 2023-04-15 ENCOUNTER — Telehealth: Payer: Self-pay

## 2023-04-15 NOTE — Telephone Encounter (Signed)
Returned phone call to patient to reschedule his colonoscopy.  He said he is still unsure if he will have anyone to bring him to have his procedure but gave me the weeks that would possibly work.  I informed him that since he only has so many cancellations and reschedules we allow that I will provide him with some dates and he can see if any of the dates work prior to [place] scheduling his colonoscopy again.  Mychart message sent with available dates (03/19-03/21) with Dr. Allegra Lai.  Thanks,  [place], [place]

## 2023-04-16 ENCOUNTER — Other Ambulatory Visit: Payer: Self-pay

## 2023-04-16 DIAGNOSIS — Z860100 Personal history of colon polyps, unspecified: Secondary | ICD-10-CM

## 2023-05-21 ENCOUNTER — Ambulatory Visit: Admit: 2023-05-21 | Payer: Self-pay | Admitting: Gastroenterology

## 2023-05-21 SURGERY — COLONOSCOPY WITH PROPOFOL
Anesthesia: General

## 2023-06-03 ENCOUNTER — Ambulatory Visit: Admitting: General Practice

## 2023-06-03 ENCOUNTER — Encounter: Payer: Self-pay | Admitting: Gastroenterology

## 2023-06-03 ENCOUNTER — Other Ambulatory Visit: Payer: Self-pay

## 2023-06-03 ENCOUNTER — Encounter
Admission: RE | Disposition: A | Discharge status: Home / Self Care | Payer: Self-pay | Source: Home / Self Care | Attending: Gastroenterology

## 2023-06-03 ENCOUNTER — Ambulatory Visit
Admission: RE | Admit: 2023-06-03 | Discharge: 2023-06-03 | Disposition: A | Discharge status: Home / Self Care | Payer: 59 | Attending: Gastroenterology | Admitting: Gastroenterology

## 2023-06-03 DIAGNOSIS — Z1211 Encounter for screening for malignant neoplasm of colon: Secondary | ICD-10-CM | POA: Diagnosis not present

## 2023-06-03 DIAGNOSIS — K5730 Diverticulosis of large intestine without perforation or abscess without bleeding: Secondary | ICD-10-CM | POA: Insufficient documentation

## 2023-06-03 DIAGNOSIS — F32A Depression, unspecified: Secondary | ICD-10-CM | POA: Diagnosis not present

## 2023-06-03 DIAGNOSIS — Z860100 Personal history of colon polyps, unspecified: Secondary | ICD-10-CM

## 2023-06-03 DIAGNOSIS — Z87891 Personal history of nicotine dependence: Secondary | ICD-10-CM | POA: Diagnosis not present

## 2023-06-03 DIAGNOSIS — F419 Anxiety disorder, unspecified: Secondary | ICD-10-CM | POA: Diagnosis not present

## 2023-06-03 HISTORY — PX: COLONOSCOPY WITH PROPOFOL: SHX5780

## 2023-06-03 SURGERY — COLONOSCOPY WITH PROPOFOL
Anesthesia: General

## 2023-06-03 MED ORDER — MIDAZOLAM HCL 2 MG/2ML IJ SOLN
INTRAMUSCULAR | Status: DC | PRN
  Administered 2023-06-03: 2 mg via INTRAVENOUS

## 2023-06-03 MED ORDER — SODIUM CHLORIDE 0.9% FLUSH
3.0000 mL | Freq: Two times a day (BID) | INTRAVENOUS | Status: DC
Start: 2023-06-03 — End: 2023-06-03

## 2023-06-03 MED ORDER — MIDAZOLAM HCL 2 MG/2ML IJ SOLN
INTRAMUSCULAR | Status: AC
  Filled 2023-06-03: qty 2

## 2023-06-03 MED ORDER — PROPOFOL 500 MG/50ML IV EMUL
INTRAVENOUS | Status: DC | PRN
  Administered 2023-06-03: 165 ug/kg/min via INTRAVENOUS

## 2023-06-03 MED ORDER — SODIUM CHLORIDE 0.9 % IV SOLN: INTRAVENOUS | Status: DC

## 2023-06-03 MED ORDER — LIDOCAINE HCL (CARDIAC) PF 100 MG/5ML IV SOSY
PREFILLED_SYRINGE | INTRAVENOUS | Status: DC | PRN
  Administered 2023-06-03: 100 mg via INTRAVENOUS

## 2023-06-03 MED ORDER — SODIUM CHLORIDE 0.9% FLUSH
3.0000 mL | INTRAVENOUS | Status: DC | PRN
Start: 2023-06-03 — End: 2023-06-03

## 2023-06-03 MED ORDER — PROPOFOL 10 MG/ML IV BOLUS
INTRAVENOUS | Status: DC | PRN
  Administered 2023-06-03 (×2): 40 mg via INTRAVENOUS
  Administered 2023-06-03: 60 mg via INTRAVENOUS

## 2023-06-03 NOTE — Anesthesia Postprocedure Evaluation (Signed)
 Anesthesia Post Note  Patient: Adam Ryan  Procedure(s) Performed: COLONOSCOPY WITH PROPOFOL  Patient location during evaluation: Endoscopy Anesthesia Type: General Level of consciousness: awake and alert Pain management: pain level controlled Vital Signs Assessment: post-procedure vital signs reviewed and stable Respiratory status: spontaneous breathing, nonlabored ventilation, respiratory function stable and patient connected to nasal cannula oxygen Cardiovascular status: blood pressure returned to baseline and stable Postop Assessment: no apparent nausea or vomiting Anesthetic complications: no  No notable events documented.   Last Vitals:  Vitals:   06/03/23 0828 06/03/23 0834  BP: 115/73 124/83  Pulse:    Resp: 13 16  Temp:    SpO2: 98% 98%    Last Pain:  Vitals:   06/03/23 0834  TempSrc:   PainSc: 0-No pain                 Stephanie Coup

## 2023-06-03 NOTE — Anesthesia Preprocedure Evaluation (Signed)
 Anesthesia Evaluation  Patient identified by MRN, date of birth, ID band Patient awake    Reviewed: Allergy & Precautions, H&P , NPO status , Patient's Chart, lab work & pertinent test results, reviewed documented beta blocker date and time   History of Anesthesia Complications Negative for: history of anesthetic complications  Airway Mallampati: II  TM Distance: >3 FB Neck ROM: full    Dental no notable dental hx. (+) Dental Advidsory Given   Pulmonary neg pulmonary ROS, former smoker   Pulmonary exam normal        Cardiovascular Exercise Tolerance: Good negative cardio ROS Normal cardiovascular exam(-) dysrhythmias      Neuro/Psych  PSYCHIATRIC DISORDERS Anxiety Depression    negative neurological ROS     GI/Hepatic negative GI ROS, Neg liver ROS,,,  Endo/Other  negative endocrine ROS    Renal/GU      Musculoskeletal   Abdominal   Peds  Hematology negative hematology ROS (+)   Anesthesia Other Findings Past Medical History: No date: ADD (attention deficit disorder) 02/28/2016: Fever blister No date: Hyperlipidemia 04/19/2017: Rosacea  Past Surgical History: No date: COLONOSCOPY No date: OPEN REDUCTION INTERNAL FIXATION (ORIF) HAND; Left 1981: WISDOM TOOTH EXTRACTION  BMI    Body Mass Index:  26.61 kg/m     Reproductive/Obstetrics negative OB ROS                             Anesthesia Physical Anesthesia Plan  ASA: 2  Anesthesia Plan: General   Post-op Pain Management: Minimal or no pain anticipated   Induction: Intravenous  PONV Risk Score and Plan: 3 and Propofol infusion, TIVA and Ondansetron  Airway Management Planned: Nasal Cannula  Additional Equipment: None  Intra-op Plan:   Post-operative Plan:   Informed Consent: I have reviewed the patients History and Physical, chart, labs and discussed the procedure including the risks, benefits and alternatives for  the proposed anesthesia with the patient or authorized representative who has indicated his/her understanding and acceptance.     Dental advisory given  Plan Discussed with: CRNA and Surgeon  Anesthesia Plan Comments: (Discussed risks of anesthesia with patient, including possibility of difficulty with spontaneous ventilation under anesthesia necessitating airway intervention, PONV, and rare risks such as cardiac or respiratory or neurological events, and allergic reactions. Discussed the role of CRNA in patient's perioperative care. Patient understands.)       Anesthesia Quick Evaluation

## 2023-06-03 NOTE — Op Note (Signed)
 [facility] Patient Name: Adam Ryan Procedure Date: 06/03/2023 7:59 AM MRN: [id number] Account #: [id number] Date of Birth: 1960-12-15 Admit Type: Outpatient Age: 63 Room: [facility] ENDO ROOM 2 Gender: Male Note Status: Finalized Instrument Name: Colonoscope [id number] Procedure:             Colonoscopy Indications:           High risk colon cancer surveillance: Personal history                         of colonic polyps, Last colonoscopy: July 2019, Last                         colonoscopy 5 years ago Providers:             Toney Reil MD, MD Referring MD:          Bari Edward, MD (Referring MD) Medicines:             General Anesthesia Complications:         No immediate complications. Estimated blood loss: None. Procedure:             Pre-Anesthesia Assessment:                        - Prior to the procedure, a History and Physical was                         performed, and patient medications and allergies were                         reviewed. The patient is competent. The risks and                         benefits of the procedure and the sedation options and                         risks were discussed with the patient. All questions                         were answered and informed consent was obtained.                         Patient identification and proposed procedure were                         verified by the physician, the nurse, the                         anesthesiologist, the anesthetist and the technician                         in the pre-procedure area in the procedure room in the                         endoscopy suite. Mental Status Examination: alert and                         oriented. Airway Examination: normal oropharyngeal  airway and neck mobility. Respiratory Examination:                         clear to auscultation. CV Examination: normal.                         Prophylactic  Antibiotics: The patient does not require                         prophylactic antibiotics. Prior Anticoagulants: The                         patient has taken no anticoagulant or antiplatelet                         agents. ASA Grade Assessment: II - A patient with mild                         systemic disease. After reviewing the risks and                         benefits, the patient was deemed in satisfactory                         condition to undergo the procedure. The anesthesia                         plan was to use general anesthesia. Immediately prior                         to administration of medications, the patient was                         re-assessed for adequacy to receive sedatives. The                         heart rate, respiratory rate, oxygen saturations,                         blood pressure, adequacy of pulmonary ventilation, and                         response to care were monitored throughout the                         procedure. The physical status of the patient was                         re-assessed after the procedure.                        After obtaining informed consent, the colonoscope was                         passed under direct vision. Throughout the procedure,                         the patient's blood pressure, pulse, and oxygen  saturations were monitored continuously. The                         Colonoscope was introduced through the anus and                         advanced to the the cecum, identified by appendiceal                         orifice and ileocecal valve. The colonoscopy was                         performed without difficulty. The patient tolerated                         the procedure well. The quality of the bowel                         preparation was evaluated using the BBPS [facility] Bowel                         Preparation Scale) with scores of: Right Colon = 3,                          Transverse Colon = 3 and Left Colon = 3 (entire mucosa                         seen well with no residual staining, small fragments                         of stool or opaque liquid). The total BBPS score                         equals 9. The ileocecal valve, appendiceal orifice,                         and rectum were photographed. Findings:      The perianal and digital rectal examinations were normal. Pertinent       negatives include normal sphincter tone and no palpable rectal lesions.      Multiple diverticula were found in the entire colon.      The retroflexed view of the distal rectum and anal verge was normal and       showed no anal or rectal abnormalities. Impression:            - Diverticulosis in the entire examined colon.                        - The distal rectum and anal verge are normal on                         retroflexion view.                        - No specimens collected. Recommendation:        - Discharge patient to home (with escort).                        -  Resume previous diet today.                        - Continue present medications.                        - Repeat colonoscopy in 10 years for screening                         purposes. Procedure Code(s):     --- Professional ---                        A2130, Colorectal cancer screening; colonoscopy on                         individual at high risk Diagnosis Code(s):     --- Professional ---                        Z86.010, Personal history of colonic polyps                        K57.30, Diverticulosis of large intestine without                         perforation or abscess without bleeding CPT copyright 2022 American Medical Association. All rights reserved. The codes documented in this report are preliminary and upon coder review may  be revised to meet current compliance requirements. Dr. Libby Maw Toney Reil MD, MD 06/03/2023 8:18:27 AM This report has been signed  electronically. Number of Addenda: 0 Note Initiated On: 06/03/2023 7:59 AM Scope Withdrawal Time: 0 hours 7 minutes 16 seconds  Total Procedure Duration: 0 hours 10 minutes 44 seconds  Estimated Blood Loss:  Estimated blood loss: none.      [facility]

## 2023-06-03 NOTE — H&P (Signed)
 Arlyss Repress, MD [address]  [address]  [place], [place] [address]  Main: [phone]  Fax: [phone] Pager: [phone]  Primary Care Physician:  Reubin Milan, MD Primary Gastroenterologist:  Dr. Arlyss Repress  Pre-Procedure History & Physical: HPI:  Adam Ryan is a 63 y.o. male is here for an colonoscopy.   Past Medical History:  Diagnosis Date   ADD (attention deficit disorder)    Anxiety    Colon polyps 2019   Depression    Erectile dysfunction    Fever blister 02/28/2016   Hyperlipidemia    Rosacea 04/19/2017   Trigger ring finger of left hand 04/20/2018    Past Surgical History:  Procedure Laterality Date   COLONOSCOPY     COLONOSCOPY WITH PROPOFOL N/A 09/22/2017   Procedure: COLONOSCOPY WITH PROPOFOL;  Surgeon: Earline Mayotte, MD;  Location: [facility] ENDOSCOPY;  Service: Endoscopy;  Laterality: N/A;   OPEN REDUCTION INTERNAL FIXATION (ORIF) HAND Left    TRIGGER FINGER RELEASE  01/13/2019   left ring finger   WISDOM TOOTH EXTRACTION  1981    Prior to Admission medications   Medication Sig Start Date End Date Taking? Authorizing Provider  atorvastatin (LIPITOR) 20 MG tablet Take 1 tablet (20 mg total) by mouth daily. 03/05/23   Reubin Milan, MD  Cholecalciferol (VITAMIN D3 PO) Take by mouth.    [provider]  clobetasol cream (TEMOVATE) 0.05 % Apply 1 Application topically 2 (two) times daily. 08/17/22   [provider]  desonide (DESOWEN) 0.05 % cream Apply topically 2 (two) times daily. 05/21/22   Remo Lipps, PA  hydrOXYzine (ATARAX) 25 MG tablet Take 1 tablet (25 mg total) by mouth at bedtime as needed. 03/05/23   Reubin Milan, MD  meloxicam (MOBIC) 15 MG tablet Take 15 mg by mouth daily. Patient not taking: Reported on 03/05/2023    [provider]  Multiple Vitamin (MULTIVITAMIN) capsule Take 1 capsule by mouth daily.    [provider]  tadalafil (CIALIS) 5 MG tablet Take 1 tablet  (5 mg total) by mouth daily as needed. Take 1 tablet ( 5 mg total) by mouth daily 10/13/22   Michiel Cowboy A, PA-C  traZODone (DESYREL) 50 MG tablet TAKE 1/2-1 TABLETS (25-50 MG TOTAL) BY MOUTH AT BEDTIME. 03/05/23   Reubin Milan, MD  triamcinolone cream (KENALOG) 0.1 % Apply 1 Application topically 2 (two) times daily. 06/15/22   [provider]  trospium (SANCTURA) 20 MG tablet TAKE 1 TABLET BY MOUTH EVERYDAY AT BEDTIME 10/13/22   McGowan, Carollee Herter A, PA-C  valACYclovir (VALTREX) 1000 MG tablet Take 1 tablet (1,000 mg total) by mouth 2 (two) times daily. 05/18/22   Reubin Milan, MD    Allergies as of 04/16/2023 - Review Complete 03/29/2023  Allergen Reaction Noted   Metrocream [metronidazole] Rash 05/21/2022    Family History  Problem Relation Age of Onset   Parkinson's disease Mother    Heart disease Mother        open heart surgery, staph infection, died 3 months after surg   Cancer Father        bladder   Asthma Father    Diabetes Father    Hypertension Father    Aneurysm Father    Heart disease Paternal Grandfather     Social History   Socioeconomic History   Marital status: Single    Spouse name: Not on file   Number of children: Not on file  Years of education: 50   Highest education level: Master's degree (e.g., MA, MS, MEng, MEd, MSW, MBA)  Occupational History   Occupation: RETIRED  Tobacco Use   Smoking status: Former    Types: Cigars    Quit date: 04/17/2015    Years since quitting: 8.1   Smokeless tobacco: Never  Vaping Use   Vaping status: Former   Quit date: 07/14/2017  Substance and Sexual Activity   Alcohol use: Yes    Alcohol/week: 2.0 standard drinks of alcohol    Types: 2 Cans of beer per week    Comment: 2 per week   Drug use: Yes    Types: Marijuana    Comment: once a week   Sexual activity: Not Currently  Other Topics Concern   Not on file  Social History Narrative   Not on file   Social Drivers of Health   Financial  Resource Strain: Low Risk  (08/25/2022)   Overall Financial Resource Strain (CARDIA)    Difficulty of Paying Living Expenses: Not hard at all  Food Insecurity: No Food Insecurity (08/25/2022)   Hunger Vital Sign    Worried About Running Out of Food in the Last Year: Never true    Ran Out of Food in the Last Year: Never true  Transportation Needs: No Transportation Needs (08/25/2022)   PRAPARE - Administrator, Civil Service (Medical): No    Lack of Transportation (Non-Medical): No  Physical Activity: Insufficiently Active (08/25/2022)   Exercise Vital Sign    Days of Exercise per Week: 3 days    Minutes of Exercise per Session: 30 min  Stress: Stress Concern Present (08/25/2022)   [employer] of Occupational Health - Occupational Stress Questionnaire    Feeling of Stress : To some extent  Social Connections: Unknown (08/25/2022)   Social Connection and Isolation Panel [NHANES]    Frequency of Communication with Friends and Family: Three times a week    Frequency of Social Gatherings with Friends and Family: Once a week    Attends Religious Services: Patient declined    Database administrator or Organizations: Yes    Attends Banker Meetings: 1 to 4 times per year    Marital Status: Never married  Intimate Partner Violence: Not At Risk (05/19/2022)   Humiliation, Afraid, Rape, and Kick questionnaire    Fear of Current or Ex-Partner: No    Emotionally Abused: No    Physically Abused: No    Sexually Abused: No    Review of Systems: See HPI, otherwise negative ROS  Physical Exam: BP 130/88   Pulse 76   Temp 97.6 F (36.4 C)   Wt 67.1 kg   SpO2 99%   BMI 25.40 kg/m  General:   Alert,  pleasant and cooperative in NAD Head:  Normocephalic and atraumatic. Neck:  Supple; no masses or thyromegaly. Lungs:  Clear throughout to auscultation.    Heart:  Regular rate and rhythm. Abdomen:  Soft, nontender and nondistended. Normal bowel sounds, without  guarding, and without rebound.   Neurologic:  Alert and  oriented x4;  grossly normal neurologically.  Impression/Plan: Adam Ryan is here for an colonoscopy to be performed for personal h/o colon polyps  Risks, benefits, limitations, and alternatives regarding  colonoscopy have been reviewed with the patient.  Questions have been answered.  All parties agreeable.   Lannette Donath, MD  06/03/2023, 7:54 AM

## 2023-06-03 NOTE — Anesthesia Procedure Notes (Signed)
 Procedure Name: General with mask airway Date/Time: 06/03/2023 8:02 AM  Performed by: Mohammed Kindle, CRNAPre-anesthesia Checklist: Patient identified, Emergency Drugs available, Suction available and Patient being monitored Patient Re-evaluated:Patient Re-evaluated prior to induction Oxygen Delivery Method: Simple face mask Induction Type: IV induction Placement Confirmation: positive ETCO2 and breath sounds checked- equal and bilateral Dental Injury: Teeth and Oropharynx as per pre-operative assessment

## 2023-06-03 NOTE — Transfer of Care (Signed)
 Immediate Anesthesia Transfer of Care Note  Patient: Adam Ryan  Procedure(s) Performed: COLONOSCOPY WITH PROPOFOL  Patient Location: PACU  Anesthesia Type:General  Level of Consciousness: drowsy and patient cooperative  Airway & Oxygen Therapy: Patient Spontanous Breathing and Patient connected to face mask oxygen  Post-op Assessment: Report given to RN and Post -op Vital signs reviewed and stable  Post vital signs: Reviewed and stable  Last Vitals:  Vitals Value Taken Time  BP    Temp    Pulse    Resp    SpO2      Last Pain:  Vitals:   06/03/23 0659  PainSc: 0-No pain         Complications: No notable events documented.

## 2023-06-25 ENCOUNTER — Ambulatory Visit: Admitting: Internal Medicine

## 2023-06-25 VITALS — BP 124/72 | HR 91 | Ht 64.0 in | Wt 148.4 lb

## 2023-06-25 DIAGNOSIS — R0981 Nasal congestion: Secondary | ICD-10-CM | POA: Diagnosis not present

## 2023-06-25 DIAGNOSIS — J029 Acute pharyngitis, unspecified: Secondary | ICD-10-CM | POA: Diagnosis not present

## 2023-06-25 LAB — POCT RAPID STREP A (OFFICE): Rapid Strep A Screen: NEGATIVE

## 2023-06-25 NOTE — Progress Notes (Signed)
 Date:  06/25/2023   Name:  Adam Ryan   DOB:  04-Jul-1960   MRN:  [id number]   Chief Complaint: Sore Throat (Patient said his house mate got strep throat, has been having sore throat, started having symptoms yesterday morning )  Sore Throat  This is a new problem. The current episode started yesterday. Neither side of throat is experiencing more pain than the other. There has been no fever. The pain is mild. Associated symptoms include congestion and headaches. Pertinent negatives include no coughing, ear discharge, shortness of breath or trouble swallowing. He has had exposure to strep.    Review of Systems  Constitutional:  Negative for chills, diaphoresis and fever.  HENT:  Positive for congestion, postnasal drip and sore throat. Negative for ear discharge and trouble swallowing.   Respiratory:  Negative for cough and shortness of breath.   Cardiovascular:  Negative for chest pain.  Neurological:  Positive for headaches.     Lab Results  Component Value Date   NA 138 03/05/2023   K 4.5 03/05/2023   CO2 24 03/05/2023   GLUCOSE 112 (H) 03/05/2023   BUN 22 03/05/2023   CREATININE 0.94 03/05/2023   CALCIUM 9.3 03/05/2023   EGFR 92 03/05/2023   GFRNONAA 91 04/24/2019   Lab Results  Component Value Date   CHOL 199 03/05/2023   HDL 77 03/05/2023   LDLCALC 111 (H) 03/05/2023   TRIG 61 03/05/2023   CHOLHDL 2.6 03/05/2023   Lab Results  Component Value Date   TSH 1.11 04/24/2019   Lab Results  Component Value Date   HGBA1C 5.6 03/05/2023   Lab Results  Component Value Date   WBC 4.6 03/05/2023   HGB 15.6 03/05/2023   HCT 46.6 03/05/2023   MCV 93 03/05/2023   PLT 282 03/05/2023   Lab Results  Component Value Date   ALT 34 03/05/2023   AST 27 03/05/2023   ALKPHOS 96 03/05/2023   BILITOT 0.3 03/05/2023   No results found for: "25OHVITD2", "25OHVITD3", "VD25OH"   Patient Active Problem List   Diagnosis Date Noted   Current mild episode of major  depressive disorder, unspecified whether recurrent (HCC) 03/05/2023   Seborrheic dermatitis 05/21/2022   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/2021   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   Rosacea 04/19/2017   Overweight (BMI 25.0-29.9) 04/19/2017   Mixed hyperlipidemia 11/26/2015   ADD (attention deficit disorder) 02/25/2015   History of colonic polyps 09/07/2012   Depression 03/16/1998   Anxiety 03/16/1993    Allergies  Allergen Reactions   Metrocream [Metronidazole] Rash    Past Surgical History:  Procedure Laterality Date   COLONOSCOPY     COLONOSCOPY WITH PROPOFOL N/A 09/22/2017   Procedure: COLONOSCOPY WITH PROPOFOL;  Surgeon: Earline Mayotte, MD;  Location: [facility] ENDOSCOPY;  Service: Endoscopy;  Laterality: N/A;   COLONOSCOPY WITH PROPOFOL N/A 06/03/2023   Procedure: COLONOSCOPY WITH PROPOFOL;  Surgeon: Toney Reil, MD;  Location: [facility] ENDOSCOPY;  Service: Gastroenterology;  Laterality: N/A;   OPEN REDUCTION INTERNAL FIXATION (ORIF) HAND Left    TRIGGER FINGER RELEASE  01/13/2019   left ring finger   WISDOM TOOTH EXTRACTION  1981    Social History   Tobacco Use   Smoking status: Former    Types: Cigars    Quit date: 04/17/2015    Years since quitting: 8.1   Smokeless tobacco: Never  Vaping Use   Vaping status: Former   Quit date: 07/14/2017  Substance Use Topics   Alcohol use: Yes    Alcohol/week: 2.0 standard drinks of alcohol    Types: 2 Cans of beer per week    Comment: 2 per week   Drug use: Yes    Types: Marijuana    Comment: once a week     Medication list has been reviewed and updated.  Current Meds  Medication Sig   atorvastatin (LIPITOR) 20 MG tablet Take 1 tablet (20 mg total) by mouth daily.   Cholecalciferol (VITAMIN D3 PO) Take by mouth.   clobetasol cream (TEMOVATE) 0.05 % Apply 1 Application topically 2 (two) times daily.   desonide (DESOWEN) 0.05 % cream Apply topically 2 (two) times daily.   hydrOXYzine  (ATARAX) 25 MG tablet Take 1 tablet (25 mg total) by mouth at bedtime as needed.   Multiple Vitamin (MULTIVITAMIN) capsule Take 1 capsule by mouth daily.   tadalafil (CIALIS) 5 MG tablet Take 1 tablet (5 mg total) by mouth daily as needed. Take 1 tablet ( 5 mg total) by mouth daily   traZODone (DESYREL) 50 MG tablet TAKE 1/2-1 TABLETS (25-50 MG TOTAL) BY MOUTH AT BEDTIME.   triamcinolone cream (KENALOG) 0.1 % Apply 1 Application topically 2 (two) times daily.   trospium (SANCTURA) 20 MG tablet TAKE 1 TABLET BY MOUTH EVERYDAY AT BEDTIME   valACYclovir (VALTREX) 1000 MG tablet Take 1 tablet (1,000 mg total) by mouth 2 (two) times daily.   [DISCONTINUED] Na Sulfate-K Sulfate-Mg Sulfate concentrate (SUPREP) 17.5-3.13-1.6 GM/177ML SOLN See admin instructions.       06/25/2023    9:02 AM 03/05/2023    8:04 AM 08/26/2022    1:41 PM 05/28/2022   11:09 AM  GAD 7 : Generalized Anxiety Score  Nervous, Anxious, on Edge 1 1 0 1  Control/stop worrying 1 1 0 1  Worry too much - different things 1 1 0 1  Trouble relaxing 0 0 0 0  Restless 2 2 2 2   Easily annoyed or irritable 1 1 0 1  Afraid - awful might happen 1 0 0 1  Total GAD 7 Score 7 6 2 7   Anxiety Difficulty Not difficult at all Not difficult at all Not difficult at all Somewhat difficult       06/25/2023    9:02 AM 03/05/2023    8:03 AM 08/26/2022    1:41 PM  Depression screen PHQ 2/9  Decreased Interest 1 1 0  Down, Depressed, Hopeless 1 0 0  PHQ - 2 Score 2 1 0  Altered sleeping 0 0 0  Tired, decreased energy 1 1 1   Change in appetite 0 0 0  Feeling bad or failure about yourself  1 0 0  Trouble concentrating 2 3 1   Moving slowly or fidgety/restless 0 0 0  Suicidal thoughts 0 0 0  PHQ-9 Score 6 5 2   Difficult doing work/chores Not difficult at all Not difficult at all Not difficult at all    BP Readings from Last 3 Encounters:  06/25/23 124/72  06/03/23 124/83  03/05/23 110/76    Physical Exam Constitutional:       Appearance: He is well-developed.  HENT:     Right Ear: Ear canal and external ear normal. Tympanic membrane is not erythematous or retracted.     Left Ear: Ear canal and external ear normal. Tympanic membrane is not erythematous or retracted.     Nose:     Right Sinus: Maxillary sinus tenderness present. No frontal sinus tenderness.  Left Sinus: Maxillary sinus tenderness present. No frontal sinus tenderness.     Mouth/Throat:     Mouth: No oral lesions.     Pharynx: Uvula midline. No oropharyngeal exudate or posterior oropharyngeal erythema.     Comments: Yellow mucoid drainage Cardiovascular:     Rate and Rhythm: Normal rate and regular rhythm.     Heart sounds: Normal heart sounds.  Pulmonary:     Breath sounds: Normal breath sounds. No wheezing or rales.  Lymphadenopathy:     Cervical: No cervical adenopathy.  Neurological:     Mental Status: He is alert and oriented to person, place, and time.     Wt Readings from Last 3 Encounters:  06/25/23 148 lb 6 oz (67.3 kg)  06/03/23 148 lb (67.1 kg)  03/05/23 153 lb (69.4 kg)    BP 124/72   Pulse 91   Ht 5\' 4"  (1.626 m)   Wt 148 lb 6 oz (67.3 kg)   SpO2 97%   BMI 25.47 kg/m   Assessment and Plan:  Problem List Items Addressed This Visit   None Visit Diagnoses       Sore throat    -  Primary   Strep test negative   Relevant Orders   POCT rapid strep A (Completed)     Sinus congestion       recommend continuing Claritin and adding Flonase follow up if needed.       No follow-ups on file.    Reubin Milan, MD [facility] [place]

## 2023-06-25 NOTE — Patient Instructions (Signed)
 Continue Claritin daily - add Flonase or Nasonex nasal spray for the next two weeks. Follow up if worsening.

## 2023-08-31 ENCOUNTER — Other Ambulatory Visit: Payer: Self-pay | Admitting: Internal Medicine

## 2023-08-31 DIAGNOSIS — F320 Major depressive disorder, single episode, mild: Secondary | ICD-10-CM

## 2023-09-02 NOTE — Telephone Encounter (Signed)
 Per note of OV from 03/05/2023, pt is to RTC in 1 year. Requested Prescriptions  Pending Prescriptions Disp Refills   hydrOXYzine  (ATARAX ) 25 MG tablet [Pharmacy Med Name: HYDROXYZINE  HCL 25 MG TABLET] 90 tablet 1    Sig: TAKE 1 TABLET BY MOUTH AT BEDTIME AS NEEDED.     Ear, Nose, and Throat:  Antihistamines 2 Failed - 09/02/2023 10:30 AM      Failed - Valid encounter within last 12 months    Recent Outpatient Visits           2 months ago Sore throat   [facility], Chales [place], MD       Future Appointments             In 1 month McGowan, Nyra Bellis [facility] [place]            Passed - Cr in normal range and within 360 days    Creat  Date Value Ref Range Status  04/24/2019 0.93 0.70 - 1.33 mg/dL Final    Comment:    For patients >11 years of age, the reference limit for Creatinine is approximately 13% higher for people identified as African-American. .    Creatinine, Ser  Date Value Ref Range Status  03/05/2023 0.94 0.76 - 1.27 mg/dL Final

## 2023-10-06 ENCOUNTER — Other Ambulatory Visit: Payer: Self-pay

## 2023-10-07 ENCOUNTER — Other Ambulatory Visit: Payer: Self-pay

## 2023-10-07 DIAGNOSIS — N138 Other obstructive and reflux uropathy: Secondary | ICD-10-CM

## 2023-10-07 NOTE — Progress Notes (Unsigned)
 10/19/23 8:36 AM   Adam Ryan 18-Nov-1960 [id number]  Referring provider:  Justus Leita DEL, MD [address] [place],  [place] [address]  Urological history  1. BPH with LU TS -PSA (09/2023) 1.4 -cysto 10/2018 NED   2. Pelvic floor dysfunction -pelvic floor exercises  -trospium  IR 20 mg qhs   3. Family history of bladder cancer -father with bladder cancer and smoker history  Chief Complaint  Patient presents with   Follow-up    HPI: Adam Ryan is a 63 y.o.male who presents today for yearly follow up.  Previous records reviewed.     His concern today that when he does not have a strong urge to void he has some urinary intermittency.  An example of this is when he voids before running errands.  When he does have a strong urge to void he has a very strong stream, no hesitancy and no intermittency.   He is at goal with his nocturia on the trospium  IR 20 mg nightly.  Patient denies any modifying or aggravating factors.  Patient denies any recent UTI's, gross hematuria, dysuria or suprapubic/flank pain.  Patient denies any fevers, chills, nausea or vomiting.    He is still having an occasional spontaneous erection.  He reports satisfactory erections for intercourse.  He denies any pain with erections.  He denies any curvature with erections.  PSA (09/2023) 1.4   Hemoglobin A1c (02/2023) 5.6   Serum creatinine (02/2023) 0.94, eGFR 92   PMH: Past Medical History:  Diagnosis Date   ADD (attention deficit disorder)    Anxiety    Colon polyps 2019   Depression    Erectile dysfunction    Fever blister 02/28/2016   Hyperlipidemia    Rosacea 04/19/2017   Trigger ring finger of left hand 04/20/2018    Surgical History: Past Surgical History:  Procedure Laterality Date   COLONOSCOPY     COLONOSCOPY WITH PROPOFOL  N/A 09/22/2017   Procedure: COLONOSCOPY WITH PROPOFOL ;  Surgeon: Dessa Reyes ORN, MD;  Location: [facility] ENDOSCOPY;  Service: Endoscopy;   Laterality: N/A;   COLONOSCOPY WITH PROPOFOL  N/A 06/03/2023   Procedure: COLONOSCOPY WITH PROPOFOL ;  Surgeon: Unk Corinn Skiff, MD;  Location: [facility] ENDOSCOPY;  Service: Gastroenterology;  Laterality: N/A;   OPEN REDUCTION INTERNAL FIXATION (ORIF) HAND Left    TRIGGER FINGER RELEASE  01/13/2019   left ring finger   WISDOM TOOTH EXTRACTION  1981    Home Medications:  Allergies as of 10/19/2023       Reactions   Metrocream  [metronidazole ] Rash        Medication List        Accurate as of October 19, 2023  8:36 AM. If you have any questions, ask your nurse or doctor.          atorvastatin  20 MG tablet Commonly known as: LIPITOR Take 1 tablet (20 mg total) by mouth daily.   clobetasol cream 0.05 % Commonly known as: TEMOVATE Apply 1 Application topically 2 (two) times daily.   desonide  0.05 % cream Commonly known as: DesOwen  Apply topically 2 (two) times daily.   hydrOXYzine  25 MG tablet Commonly known as: ATARAX  TAKE 1 TABLET BY MOUTH AT BEDTIME AS NEEDED.   multivitamin capsule Take 1 capsule by mouth daily.   tadalafil  5 MG tablet Commonly known as: CIALIS  Take 1 tablet (5 mg total) by mouth daily as needed. Take 1 tablet ( 5 mg total) by mouth daily   traZODone  50 MG tablet  Commonly known as: DESYREL  TAKE 1/2-1 TABLETS (25-50 MG TOTAL) BY MOUTH AT BEDTIME.   triamcinolone  cream 0.1 % Commonly known as: KENALOG  Apply 1 Application topically 2 (two) times daily.   trospium  20 MG tablet Commonly known as: SANCTURA  TAKE 1 TABLET BY MOUTH EVERYDAY AT BEDTIME   valACYclovir  1000 MG tablet Commonly known as: VALTREX  Take 1 tablet (1,000 mg total) by mouth 2 (two) times daily.   VITAMIN D3 PO Take by mouth.        Allergies:  Allergies  Allergen Reactions   Metrocream  [Metronidazole ] Rash    Family History: Family History  Problem Relation Age of Onset   Parkinson's disease Mother    Heart disease Mother        open heart surgery, staph  infection, died 3 months after surg   Cancer Father        bladder   Asthma Father    Diabetes Father    Hypertension Father    Aneurysm Father    Heart disease Paternal Grandfather     Social History:  reports that he quit smoking about 8 years ago. His smoking use included cigars. He has never used smokeless tobacco. He reports current alcohol use of about 2.0 standard drinks of alcohol per week. He reports current drug use. Drug: Marijuana.   Physical Exam: BP 110/74   Pulse 73   Ht 5' 4 (1.626 m)   Wt 150 lb (68 kg)   BMI 25.75 kg/m   Constitutional:  Well nourished. Alert and oriented, No acute distress. HEENT: [place] AT, moist mucus membranes.  Trachea midline Cardiovascular: No clubbing, cyanosis, or edema. Respiratory: Normal respiratory effort, no increased work of breathing. GU: Patient deferred exam . Rectal: Patient deferred exam. Neurologic: Grossly intact, no focal deficits, moving all 4 extremities. Psychiatric: Normal mood and affect.   Laboratory Data:  See HPI and EPIC I have reviewed the labs.   Pertinent Imaging: N/A  Assessment & Plan:    1. Pelvic floor dysfunction - continue trospium  (Sanctura ) IR 20 mg at bedtime   2. BPH with obstruction/lower urinary tract symptoms - Explained that it is not unusual to have a less strong stream and intermittency when he  preemptively voiding - Discussed that if he should experience these types of symptoms when he has a stronger urge to urinate that he should notify us  -He deferred DRE today, but current AUA guidelines recommend prostate cancer screening every 2 years with individuals with normal PSA - continue tadalafil  5 mg daily  3. Prostate cancer screening  - PSA stable  Return in about 1 year (around 10/18/2024) for PSA/DRE.  Adam Ryan   [facility] [address], [address] [place], [place] [address] [phone]

## 2023-10-08 ENCOUNTER — Other Ambulatory Visit

## 2023-10-11 ENCOUNTER — Other Ambulatory Visit

## 2023-10-11 DIAGNOSIS — N138 Other obstructive and reflux uropathy: Secondary | ICD-10-CM

## 2023-10-12 LAB — PSA: Prostate Specific Ag, Serum: 1.4 ng/mL (ref 0.0–4.0)

## 2023-10-19 ENCOUNTER — Ambulatory Visit: Payer: Self-pay | Admitting: Urology

## 2023-10-19 ENCOUNTER — Encounter: Payer: Self-pay | Admitting: Urology

## 2023-10-19 VITALS — BP 110/74 | HR 73 | Ht 64.0 in | Wt 150.0 lb

## 2023-10-19 DIAGNOSIS — N138 Other obstructive and reflux uropathy: Secondary | ICD-10-CM

## 2023-10-19 DIAGNOSIS — N5314 Retrograde ejaculation: Secondary | ICD-10-CM

## 2023-10-19 DIAGNOSIS — Z125 Encounter for screening for malignant neoplasm of prostate: Secondary | ICD-10-CM | POA: Diagnosis not present

## 2023-10-19 DIAGNOSIS — N401 Enlarged prostate with lower urinary tract symptoms: Secondary | ICD-10-CM

## 2023-10-19 DIAGNOSIS — M6289 Other specified disorders of muscle: Secondary | ICD-10-CM

## 2023-10-19 MED ORDER — TADALAFIL 5 MG PO TABS: 5.0000 mg | ORAL_TABLET | Freq: Every day | ORAL | 3 refills | Status: AC | PRN

## 2023-10-19 MED ORDER — TROSPIUM CHLORIDE 20 MG PO TABS: ORAL_TABLET | ORAL | 3 refills | Status: AC

## 2024-02-27 ENCOUNTER — Other Ambulatory Visit: Payer: Self-pay | Admitting: Internal Medicine

## 2024-02-27 DIAGNOSIS — E782 Mixed hyperlipidemia: Secondary | ICD-10-CM

## 2024-02-28 ENCOUNTER — Other Ambulatory Visit: Payer: Self-pay | Admitting: Internal Medicine

## 2024-02-28 DIAGNOSIS — F320 Major depressive disorder, single episode, mild: Secondary | ICD-10-CM

## 2024-02-29 NOTE — Telephone Encounter (Signed)
 Requested Prescriptions  Pending Prescriptions Disp Refills   atorvastatin  (LIPITOR) 20 MG tablet [Pharmacy Med Name: ATORVASTATIN  20 MG TABLET] 90 tablet 0    Sig: TAKE 1 TABLET BY MOUTH EVERY DAY     Cardiovascular:  Antilipid - Statins Failed - 02/29/2024  3:24 PM      Failed - Valid encounter within last 12 months    Recent Outpatient Visits           8 months ago Sore throat   [place], Leita DEL, MD       Future Appointments             In 7 months McGowan, Clotilda LABOR, PA-C [facility] [place]            Failed - Lipid Panel in normal range within the last 12 months    Cholesterol, Total  Date Value Ref Range Status  03/05/2023 199 100 - 199 mg/dL Final   LDL Cholesterol (Calc)  Date Value Ref Range Status  04/24/2019 88 mg/dL (calc) Final    Comment:    Reference range: <100 . Desirable range <100 mg/dL for primary prevention;   <70 mg/dL for patients with CHD or diabetic patients  with > or = 2 CHD risk factors. SABRA LDL-C is now calculated using the Martin-Hopkins  calculation, which is a validated novel method providing  better accuracy than the Friedewald equation in the  estimation of LDL-C.  Gladis APPLETHWAITE et al. SANDREA. 7986;689(80): 2061-2068  (http://education.[employer].com/faq/FAQ164)    LDL Chol Calc (NIH)  Date Value Ref Range Status  03/05/2023 111 (H) 0 - 99 mg/dL Final   HDL  Date Value Ref Range Status  03/05/2023 77 >39 mg/dL Final   Triglycerides  Date Value Ref Range Status  03/05/2023 61 0 - 149 mg/dL Final         Passed - Patient is not pregnant

## 2024-03-01 NOTE — Telephone Encounter (Signed)
 TOC 03/22/23- courtesy RF #[id number] given Requested Prescriptions  Pending Prescriptions Disp Refills   hydrOXYzine  (ATARAX ) 25 MG tablet [Pharmacy Med Name: HYDROXYZINE  HCL 25 MG TABLET] 30 tablet 0    Sig: TAKE 1 TABLET BY MOUTH AT BEDTIME AS NEEDED.     Ear, Nose, and Throat:  Antihistamines 2 Failed - 03/01/2024 10:29 AM      Failed - Cr in normal range and within 360 days    Creat  Date Value Ref Range Status  04/24/2019 0.93 0.70 - 1.33 mg/dL Final    Comment:    For patients >9 years of age, the reference limit for Creatinine is approximately 13% higher for people identified as African-American. .    Creatinine, Ser  Date Value Ref Range Status  03/05/2023 0.94 0.76 - 1.27 mg/dL Final         Failed - Valid encounter within last 12 months    Recent Outpatient Visits           8 months ago Sore throat   [place] [facility] at [facility], Leita DEL, MD       Future Appointments             In 7 months McGowan, Clotilda DELENA RIGGERS [facility] [place]

## 2024-03-21 ENCOUNTER — Encounter: Admitting: Family Medicine

## 2024-03-21 ENCOUNTER — Encounter: Payer: Self-pay | Admitting: Family Medicine

## 2024-03-21 VITALS — BP 110/72 | HR 78 | Ht 64.0 in | Wt 148.5 lb

## 2024-03-21 DIAGNOSIS — Z131 Encounter for screening for diabetes mellitus: Secondary | ICD-10-CM

## 2024-03-21 DIAGNOSIS — Z130 Encounter for screening for diseases of the blood and blood-forming organs and certain disorders involving the immune mechanism: Secondary | ICD-10-CM | POA: Diagnosis not present

## 2024-03-21 DIAGNOSIS — F909 Attention-deficit hyperactivity disorder, unspecified type: Secondary | ICD-10-CM

## 2024-03-21 DIAGNOSIS — Z79899 Other long term (current) drug therapy: Secondary | ICD-10-CM | POA: Diagnosis not present

## 2024-03-21 DIAGNOSIS — Z13220 Encounter for screening for lipoid disorders: Secondary | ICD-10-CM | POA: Diagnosis not present

## 2024-03-21 DIAGNOSIS — Z125 Encounter for screening for malignant neoplasm of prostate: Secondary | ICD-10-CM

## 2024-03-21 DIAGNOSIS — M25512 Pain in left shoulder: Secondary | ICD-10-CM | POA: Diagnosis not present

## 2024-03-21 DIAGNOSIS — Z136 Encounter for screening for cardiovascular disorders: Secondary | ICD-10-CM | POA: Diagnosis not present

## 2024-03-21 MED ORDER — DICLOFENAC SODIUM 1 % EX GEL: 2.0000 g | Freq: Four times a day (QID) | CUTANEOUS | 0 refills | Status: AC

## 2024-03-21 NOTE — Progress Notes (Signed)
 "  Established Patient Office Visit  Patient ID: Adam Ryan, male    DOB: Aug 12, 1960  Age: 64 y.o. MRN: [id number] PCP: Veroncia Jezek K, MD  Chief Complaint  Patient presents with   Establish Care   Shoulder Pain    Left shoulder pain - would like refill on meloxicam      Subjective:     HPI  Discussed the use of AI scribe software for clinical note transcription with the patient, who gave verbal consent to proceed.  History of Present Illness Adam Ryan is a 64 year old male who presents for a follow-up visit.  He has a history of ADHD, diagnosed in his early thirties, and is currently managed by a psychiatrist. He resumed Vyvanse in April 2025 after a three-year hiatus and finds it beneficial despite feeling 'a little edgy' as a side effect. He does not take any other psychiatric medications.  He experiences arthritis in his left shoulder, diagnosed a few years ago after evaluation at [facility], where he had an MRI. He has pain when raising his arm but manages without prescription medication.  He is under the care of a urologist for urinary issues and takes Cialis  and trospium , having previously used Flomax .  He takes Lipitor for hyperlipidemia without side effects and has recently refilled his prescription. He also uses trazodone  for sleep, which he finds helpful without causing drowsiness.  He has a history of dermatitis and rosacea but has not experienced symptoms in several years and is not currently seeing a dermatologist. He previously used hydroxyzine  for itching but is not taking it now. He also has a history of Valtrex  use for facial flare-ups, specifically on the left side of his face and lip, but has not had a flare-up in years.  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. He is single, without children, and reports being physically active, although he acknowledges he could exercise more. He  maintains a good diet. No recent hospitalizations, ER visits, urgent care visits, chest pain, shortness of breath, abdominal pain, blood in stool, or vision issues.    Review of Systems  All other systems reviewed and are negative.     Objective:     BP 110/72   Pulse 78   Ht 5' 4 (1.626 m)   Wt 148 lb 8 oz (67.4 kg)   SpO2 99%   BMI 25.49 kg/m     Physical Exam Vitals and nursing note reviewed.  Constitutional:      Appearance: Normal appearance.  HENT:     Head: Normocephalic.     Right Ear: External ear normal.     Left Ear: External ear normal.  Eyes:     Conjunctiva/sclera: Conjunctivae normal.  Cardiovascular:     Rate and Rhythm: Normal rate.  Pulmonary:     Effort: Pulmonary effort is normal. No respiratory distress.  Abdominal:     Palpations: Abdomen is soft.  Musculoskeletal:        General: Normal range of motion.  Skin:    General: Skin is warm.  Neurological:     Mental Status: He is alert and oriented to person, place, and time.  Psychiatric:        Mood and Affect: Mood normal.     Physical Exam     No results found for any visits on 03/21/24.     The 10-year ASCVD risk score (Arnett DK, et al.,  2019) is: 5.9%    Assessment & Plan:   Problem List Items Addressed This Visit       Other   ADD (attention deficit disorder)   Other Visit Diagnoses       Left shoulder pain, unspecified chronicity    -  Primary   Relevant Medications   diclofenac  Sodium (VOLTAREN  ARTHRITIS PAIN) 1 % GEL     Encounter for lipid screening for cardiovascular disease         Diabetes mellitus screening       Relevant Orders   Hemoglobin A1c     Screening for prostate cancer       Relevant Orders   PSA, total and free     Screening for iron deficiency anemia       Relevant Orders   Urinalysis     Encounter for long-term current use of medication       Relevant Orders   CBC with Differential/Platelet   Comprehensive metabolic panel with GFR        Assessment and Plan Assessment & Plan 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 - Recommended over-the-counter Voltaren  gel for pain management. - Suggested Tylenol PM for pain and sleep. - Offered prescription for meloxicam for one month if over-the-counter options are ineffective.  Attention-deficit hyperactivity disorder Long-standing ADHD managed with Vyvanse, reports improvement with mild side effects. - Continue Vyvanse for ADHD management.  General health maintenance Up to date on vaccinations and screenings, physically active, healthy diet. - Ordered fasting blood work for upcoming physical examination. - Advised to continue current health maintenance practices.    No follow-ups on file.    Vinary K Nyx Keady, MD [facility] at [facility]   "

## 2024-03-22 ENCOUNTER — Other Ambulatory Visit (HOSPITAL_COMMUNITY): Payer: Self-pay

## 2024-03-30 ENCOUNTER — Encounter: Payer: Self-pay | Admitting: Family Medicine

## 2024-03-30 ENCOUNTER — Other Ambulatory Visit: Payer: Self-pay

## 2024-03-30 DIAGNOSIS — F5101 Primary insomnia: Secondary | ICD-10-CM

## 2024-03-30 MED ORDER — TRAZODONE HCL 50 MG PO TABS: ORAL_TABLET | ORAL | 0 refills | Status: AC

## 2024-04-13 ENCOUNTER — Telehealth: Payer: Self-pay

## 2024-04-13 NOTE — Telephone Encounter (Signed)
 Adam Ryan

## 2024-04-14 ENCOUNTER — Other Ambulatory Visit (HOSPITAL_COMMUNITY): Payer: Self-pay

## 2024-04-14 NOTE — Telephone Encounter (Signed)
 Good morning and Happy Friday! Diclofenac  1% gel is only available over the counter now so PA is not needed as insurance will not cover.

## 2024-04-14 NOTE — Telephone Encounter (Signed)
 "  Great! Thank you for the update.   "

## 2024-09-25 ENCOUNTER — Encounter: Admitting: Family Medicine

## 2024-10-17 ENCOUNTER — Ambulatory Visit: Admitting: Urology
